# Patient Record
Sex: Female | Born: 1985 | Race: Black or African American | Hispanic: No | Marital: Single | Smoking: Never smoker
Health system: Southern US, Community
[De-identification: ages and names within clinical notes are randomized; demographics above are authoritative.]

## PROBLEM LIST (undated history)

## (undated) DIAGNOSIS — J45909 Unspecified asthma, uncomplicated: Secondary | ICD-10-CM

## (undated) DIAGNOSIS — D649 Anemia, unspecified: Secondary | ICD-10-CM

## (undated) HISTORY — DX: Unspecified asthma, uncomplicated: J45.909

## (undated) HISTORY — DX: Anemia, unspecified: D64.9

---

## 2020-06-06 NOTE — L&D Delivery Note (Addendum)
       Delivery Note   Corda Shutt is a 35 y.o. [id number] at [redacted]w[redacted]d Estimated Date of Delivery: 05/29/21  PRE-OPERATIVE DIAGNOSIS:  1) [redacted]w[redacted]d pregnancy.  2) 2 vessel cord 3)  Limited prenatal care 4)  Preterm labor  POST-OPERATIVE DIAGNOSIS:  1) [redacted]w[redacted]d pregnancy s/p Vaginal, Spontaneous  2)  Same  Delivery Type: Vaginal, Spontaneous    Delivery Anesthesia: None   Labor Complications:      ESTIMATED BLOOD LOSS: 100  ml    FINDINGS:   1) female infant, Apgar scores of    at 1 minute and    at 5 minutes and a birthweight of   ounces.    2) Nuchal cord: No  SPECIMENS:   PLACENTA:   Appearance: Intact    Removal: Spontaneous      Disposition:    DISPOSITION:  Infant to left in stable condition in the delivery room, with L&D personnel and mother,  NARRATIVE SUMMARY: Labor course:  Ms. Meda Dudzinski is a [id number] at [redacted]w[redacted]d who presented for labor management. She presented in advanced labor. She evidenced good maternal expulsive effort during the second stage. She went on to deliver a viable infant. The placenta delivered without problems and was noted to be complete. A perineal and vaginal examination was performed. Episiotomy/Lacerations: None    Elonda Husky, M.D. 05/03/2021 2:42 AM

## 2020-11-04 ENCOUNTER — Other Ambulatory Visit: Payer: Self-pay

## 2020-11-04 ENCOUNTER — Encounter: Payer: Self-pay | Admitting: Obstetrics and Gynecology

## 2020-11-04 ENCOUNTER — Ambulatory Visit (INDEPENDENT_AMBULATORY_CARE_PROVIDER_SITE_OTHER): Payer: BLUE CROSS/BLUE SHIELD | Admitting: Obstetrics and Gynecology

## 2020-11-04 VITALS — BP 125/80 | HR 93 | Ht 66.0 in | Wt 144.2 lb

## 2020-11-04 DIAGNOSIS — Z7689 Persons encountering health services in other specified circumstances: Secondary | ICD-10-CM | POA: Diagnosis not present

## 2020-11-04 NOTE — Progress Notes (Signed)
HPI:      [name] is a 35 y.o. [id number] who LMP was Patient's last menstrual period was 08/22/2020.  Subjective:   She presents today to establish care for her pregnancy.  She has not had an ultrasound yet but she thinks she is approximately [redacted] weeks pregnant by last menstrual period.  She has had 2 previous vaginal births at [redacted] weeks gestation.  She describes them as uncomplicated. She is experiencing some nausea and vomiting during this pregnancy. She is not yet taking prenatal vitamins. She does state that she had anemia with her 2 previous pregnancies and we have discussed over-the-counter iron with meals.    Hx: The following portions of the patient's history were reviewed and updated as appropriate:             She  has a past medical history of Anemia and Asthma. She does not have a problem list on file. She  has no past surgical history on file. Her family history includes Anemia in her brother; Asthma in her mother. She  reports that she has never smoked. She has never used smokeless tobacco. She reports previous alcohol use. She reports previous drug use. She currently has no medications in their medication list. She has No Known Allergies.       Review of Systems:  Review of Systems  Constitutional: Denied constitutional symptoms, night sweats, recent illness, fatigue, fever, insomnia and weight loss.  Eyes: Denied eye symptoms, eye pain, photophobia, vision change and visual disturbance.  Ears/Nose/Throat/Neck: Denied ear, nose, throat or neck symptoms, hearing loss, nasal discharge, sinus congestion and sore throat.  Cardiovascular: Denied cardiovascular symptoms, arrhythmia, chest pain/pressure, edema, exercise intolerance, orthopnea and palpitations.  Respiratory: Denied pulmonary symptoms, asthma, pleuritic pain, productive sputum, cough, dyspnea and wheezing.  Gastrointestinal: Denied, gastro-esophageal reflux, melena, nausea and vomiting.  Genitourinary: Denied  genitourinary symptoms including symptomatic vaginal discharge, pelvic relaxation issues, and urinary complaints.  Musculoskeletal: Denied musculoskeletal symptoms, stiffness, swelling, muscle weakness and myalgia.  Dermatologic: Denied dermatology symptoms, rash and scar.  Neurologic: Denied neurology symptoms, dizziness, headache, neck pain and syncope.  Psychiatric: Denied psychiatric symptoms, anxiety and depression.  Endocrine: Denied endocrine symptoms including hot flashes and night sweats.   Meds:   No current outpatient medications on file prior to visit.   No current facility-administered medications on file prior to visit.          Objective:     Vitals:   11/04/20 1005  BP: 125/80  Pulse: 93   Filed Weights   11/04/20 1005  Weight: 144 lb 3.2 oz (65.4 kg)              Urinary beta-hCG positive  Assessment:    [id number] There are no problems to display for this patient.    1. Encounter to establish care     Patient approximately 10 weeks estimated gestational age based on LMP.   Plan:            Prenatal Plan 1.  The patient was given prenatal literature. 2.  She was continued on prenatal vitamins. 3.  A prenatal lab panel to be drawn at nurse visit. 4.  An ultrasound was ordered to better determine an EDC. 5.  A nurse visit was scheduled. 6.  Genetic testing and testing for other inheritable conditions discussed in detail. She will decide in the future whether to have these labs performed. 7.  A general overview of pregnancy testing, visit schedule, ultrasound schedule, and  prenatal care was discussed. 8.  COVID and its risks associated with pregnancy, prevention by limiting exposure and use of masks, as well as the risks and benefits of vaccination during pregnancy were discussed in detail.  [facility] policy regarding office and hospital visitation and testing was explained. 9.  Benefits of breast-feeding discussed in detail including both maternal and  infant benefits. [employer] Baby website discussed. 10.  Literature on nausea vomiting of pregnancy given.   Orders Orders Placed This Encounter  Procedures  . US OB Comp Less 14 Wks    No orders of the defined types were placed in this encounter.     F/U  Return in about 3 weeks (around 11/25/2020). I spent 31 minutes involved in the care of this patient preparing to see the patient by obtaining and reviewing her medical history (including labs, imaging tests and prior procedures), documenting clinical information in the electronic health record (EHR), counseling and coordinating care plans, writing and sending prescriptions, ordering tests or procedures and directly communicating with the patient by discussing pertinent items from her history and physical exam as well as detailing my assessment and plan as noted above so that she has an informed understanding.  All of her questions were answered. Elonda Husky, M.D. 11/04/2020 10:28 AM

## 2020-11-05 ENCOUNTER — Other Ambulatory Visit: Payer: Self-pay | Admitting: Obstetrics and Gynecology

## 2020-11-05 DIAGNOSIS — Z7689 Persons encountering health services in other specified circumstances: Secondary | ICD-10-CM

## 2020-11-05 DIAGNOSIS — Z3491 Encounter for supervision of normal pregnancy, unspecified, first trimester: Secondary | ICD-10-CM

## 2020-11-16 ENCOUNTER — Other Ambulatory Visit: Payer: Self-pay

## 2020-11-16 ENCOUNTER — Ambulatory Visit (INDEPENDENT_AMBULATORY_CARE_PROVIDER_SITE_OTHER): Payer: BLUE CROSS/BLUE SHIELD

## 2020-11-16 VITALS — BP 119/74 | HR 94 | Ht 66.0 in | Wt 144.1 lb

## 2020-11-16 DIAGNOSIS — Z0283 Encounter for blood-alcohol and blood-drug test: Secondary | ICD-10-CM

## 2020-11-16 DIAGNOSIS — Z113 Encounter for screening for infections with a predominantly sexual mode of transmission: Secondary | ICD-10-CM

## 2020-11-16 DIAGNOSIS — Z3481 Encounter for supervision of other normal pregnancy, first trimester: Secondary | ICD-10-CM

## 2020-11-16 NOTE — Patient Instructions (Signed)
WHAT OB PATIENTS CAN EXPECT  Confirmation of pregnancy and ultrasound ordered if medically indicated-[redacted] weeks gestation New OB (NOB) intake with nurse and New OB (NOB) labs- [redacted] weeks gestation New OB (NOB) physical examination with provider- 11/[redacted] weeks gestation Flu vaccine-[redacted] weeks gestation Anatomy scan-[redacted] weeks gestation Glucose tolerance test, blood work to test for anemia, T-dap vaccine-[redacted] weeks gestation Vaginal swabs/cultures-STD/Group B strep-[redacted] weeks gestation Appointments every 4 weeks until 28 weeks Every 2 weeks from 28 weeks until 36 weeks Weekly visits from 36 weeks until delivery  Tests and Screening During Pregnancy Having certain tests and screenings during pregnancy is an important part of your prenatal care. These tests help your health care provider find problems that might affect your pregnancy. Some tests must be done for all pregnant women, and some are optional. Most of the tests and screenings do not pose any risks for you or your baby. You may need additional testing if any routinetests indicate a problem. Tests and screenings done early in pregnancy Some tests and screenings you can expect to have in early pregnancy include: Blood tests, such as: Complete blood count (CBC). This test is done to check your red and white blood cells. It can help identify a risk for anemia, infection, or bleeding. Blood typing. This test shows your blood type. It also shows whether you have a certain protein in your red blood cells called the Rh factor. It can be dangerous for your baby if you do not have this protein (Rh negative) and your baby has it (Rh positive). Tests to check for diseases that can cause birth defects or can be passed to your baby, such as: [place] measles (rubella) and chicken pox. The test indicates whether you are immune to these diseases. Hepatitis B and C. Human Immunodeficiency Virus (HIV). Syphilis. Zika virus, if you or your partner has traveled to an area  where the virus occurs. Urine testing. This checks for sugar in your urine and for signs of infection. Blood pressure. This is to check for high blood pressure and preeclampsia. Testing for sexually transmitted infections (STIs), such as chlamydia or gonorrhea. Testing for tuberculosis. You may have this skin test if you are at risk for tuberculosis. Fetal ultrasound. This is an imaging study of your growing baby. It uses sound waves to create pictures of your baby. This test may be done to help determine your due date and to ensure you do not have an ectopic pregnancy. An ectopic pregnancy is a pregnancy that grows outside of the uterus. Tests and screenings done later in pregnancy Certain tests are done for the first time later in the pregnancy. Some of the tests that were done in early pregnancy are repeated at this time. Some common tests you can expect to have later in pregnancy include: Rh antibody testing. If you are Rh negative, you will have a blood test at about 28 weeks of pregnancy to see if you are producing Rh antibodies. If you have not started to make antibodies, you will be given an injection to prevent you from making antibodies for the rest of your pregnancy. Glucose screening. This checks your blood sugar. It will show whether you are developing the type of diabetes that occurs during pregnancy (gestational diabetes). You may have this screening earlier if you have risk factors for diabetes. Screening for group B streptococcus (GBS). GBS is a type of bacteria that may live in your rectum or vagina. GBS can spread to your baby during birth. This  is done at 35-37 weeks of pregnancy. If testing is positive for GBS, you may be treated with antibiotic medicine. Urine and blood tests to monitor for other pregnancy problems, such as preeclampsia or anemia. Blood pressure to monitor for high blood pressure and preeclampsia. Fetal ultrasound. This may be repeated at 16-20 weeks to check how  your baby is growing and developing. Non-stress test. This test is done later in pregnancy to check your baby's heart rate. This may be repeated weekly if your pregnancy is high risk. Biophysical profile. This test includes ultrasound imaging and a non-stress test to ensure your baby is healthy. This test may help decide when your baby should be born. Screening for birth defects Some birth defects are caused by abnormal genes passed down through families. Early in your pregnancy, tests can be done to find out if your baby is at risk for a genetic disorder. This testing is optional. The type of testing recommended for you will depend on your family and medical history, your ethnicity, and your age. Testing may include: Screening tests. These tests may include an ultrasound, blood tests, or a combination of both. The blood tests are used to check for abnormal genes and the ultrasound is done to look for early birth defects. Carrier screening. This test involves checking the blood or saliva of both parents to see if they carry abnormal genes that could be passed down to a baby. If genetic screening shows that your baby is at risk for a genetic defect, additional diagnostic testing may be recommended, such as: Amniocentesis. This involves testing a sample of fluid from your womb (amniotic fluid). Chorionic villus sampling. In this test, a sample of cells from your placenta is checked for abnormal cells. Unlike other tests done during pregnancy, diagnostic testing does have some risk for your pregnancy. Talk to your health care provider about the risks andbenefits of genetic testing. Questions to ask your health care provider What routine tests are recommended for me? When and how will these tests be done? When will I get the results of routine tests? What do the results of these tests mean for me or my baby? Do you recommend any genetic screening tests? Which ones? Should I see a genetic counselor  before having genetic screening? Where to find more information American Pregnancy Association: americanpregnancy.org/prenatal-testing [employer]ians [employer] Gynecologists: JewelryExec.com.pt Office on [employer] Health: KeywordPortfolios.com.br [employer]: marchofdimes.[email or web address]/pregnancy Summary Having certain tests and screenings during pregnancy is an important part of your prenatal care. Talk to your health care provider about what tests are right for you and your baby. In early pregnancy, testing may be done to check your risks for various conditions that can affect you and your baby. Later in pregnancy, tests may be done to ensure that your baby is growing normally and that you and your baby are staying healthy during the pregnancy. Genetic testing is optional. Talk to your health care provider about the risks and benefits of genetic testing. This information is not intended to replace advice given to you by your health care provider. Make sure you discuss any questions you have with your healthcare provider. Document Revised: 02/11/2020 Document Reviewed: 02/11/2020 Elsevier Patient Education  2022 [employer]. How a Baby Grows During Pregnancy Pregnancy begins when a female's sperm enters a female's egg. This is called fertilization. Fertilization usually happens in one of the fallopian tubes that connect the ovaries to the uterus. The fertilized egg moves down the fallopian tube to  the uterus. Once it reaches the uterus, it implants into the lining ofthe uterus and begins to grow. For the first 8 weeks, the fertilized egg is called an embryo. After 8 weeks, it is called a fetus. As the fetus continues to grow, it receives oxygen and nutrients through the placenta, which is an organ that grows to support the developing baby. The placenta is the life support system for the baby. Itprovides oxygen and nutrition and removes waste. How long does a typical pregnancy last? A pregnancy  usually lasts 280 days, or about 40 weeks. Pregnancy is divided into three periods of growth, also called trimesters: First trimester: 0-12 weeks. Second trimester: 13-27 weeks. Third trimester: 28-40 weeks. The day when your baby is ready to be born (full term) is your estimated date of delivery. However, most babies are not born ontheir estimated date of delivery. How does my baby develop month by month?  First month The fertilized egg attaches to the inside of the uterus. Some cells will form the placenta. Others will form the fetus. The arms, legs, brain, spinal cord, lungs, and heart begin to develop. At the end of the first month, the heart begins to beat. Second month The bones, inner ear, eyelids, hands, and feet form. The genitals develop. By the end of 8 weeks, all major organs are developing. Third month All of the internal organs are forming. Teeth develop below the gums. Bones and muscles begin to grow. The spine can flex. The skin is transparent. Fingernails and toenails begin to form. Arms and legs continue to grow longer, and hands and feet develop. The fetus is about 3 inches (7.6 cm) long. Fourth month The placenta is completely formed. The external sex organs, neck, outer ear, eyebrows, eyelids, and fingernails are formed. The fetus can hear, swallow, and move its arms and legs. The kidneys begin to produce urine. The skin is covered with a white, waxy coating (vernix) and very fine hair (lanugo). Fifth month The fetus moves around more and can be felt for the first time (quickening). The fetus starts to sleep and wake up and may begin to suck a finger. The nails grow to the end of the fingers. The organ in the digestive system that makes bile (gallbladder) functions and helps to digest nutrients. If the fetus is a female, eggs are present in the ovaries. If the fetus is a female, testicles start to move down into the scrotum. Sixth month The lungs are  formed. The eyes open. The brain continues to develop. Your baby has fingerprints and toe prints. Your baby's hair grows thicker. At the end of the second trimester, the fetus is about 9 inches (22.9 cm) long. Seventh month The fetus kicks and stretches. The eyes are developed enough to sense changes in light. The hands can make a grasping motion. The fetus responds to sound. Eighth month Most organs and body systems are fully developed and functioning. Bones harden, and taste buds develop. The fetus may hiccup. Certain areas of the brain are still developing. The skull remains soft. Ninth month The fetus gains about  lb (0.23 kg) each week. The lungs are fully developed. Patterns of sleep develop. The fetus's head typically moves into a head-down position (vertex) in the uterus to prepare for birth. The fetus weighs 6-9 lb (2.72-4.08 kg) and is 19-20 inches (48.26-50.8 cm) long. How do I know if my baby is developing well? Always talk with your health care provider about any concerns that you  may have about your pregnancy and your baby. At each prenatal visit, your health care provider will do several different tests to check on your health and keep track of your baby's development. These include: Fundal height and position. To do this, your health care provider will: Measure your growing belly from your pubic bone to the top of the uterus using a tape measure. Feel your belly to determine your baby's position. Heartbeat. An ultrasound in the first trimester can confirm pregnancy and show a heartbeat, depending on how far along you are. Your health care provider will check your baby's heart rate at every prenatal visit. You will also have a second trimester ultrasound to check your baby'sdevelopment. Follow these instructions at home: Take prenatal vitamins as told by your health care provider. These include vitamins such as folic acid, iron, calcium, and vitamin D. They are important  for healthy development. Take over-the-counter and prescription medicines only as told by your health care provider. Keep all follow-up visits. This is important. Follow-up visits include prenatal care and screening tests. Summary A pregnancy usually lasts 280 days, or about 40 weeks. Pregnancy is divided into three periods of growth, also called trimesters. Your health care provider will monitor your baby's growth and development throughout your pregnancy. Follow your health care provider's recommendations about taking prenatal vitamins and medicines during your pregnancy. Talk with your health care provider if you have any concerns about your pregnancy or your developing baby. This information is not intended to replace advice given to you by your health care provider. Make sure you discuss any questions you have with your healthcare provider. Document Revised: 10/30/2019 Document Reviewed: 09/05/2019 Elsevier Patient Education  2022 [place]. Heartburn During Pregnancy  Heartburn is a type of pain or discomfort in the throat or chest. It may cause a burning feeling. It happens when stomach acid backs up into the part of the body that moves food from your mouth to your stomach (esophagus). This condition is also called acid reflux. Heartburn is common duringpregnancy. It usually goes away or gets better after giving birth. What are the causes? This condition is caused by stomach acid that backs up into the part of the body that moves food from your mouth to your stomach. Acid can back up because of: Changing amounts of hormones in the body. Large meals. Certain foods and drinks. Exercise. More acid being made in the stomach. What increases the risk? You are more likely to develop this condition if: You had heartburn before you became pregnant. You have been pregnant more than once before. You are overweight or obese. Heartburn is also likely to happen as you get further along in your  pregnancy. The risk is higher in the last 3 months before birth (third trimester). What are the signs or symptoms? Symptoms of this condition include: Burning pain in the chest or lower throat. A bitter taste in the mouth. Coughing. Problems swallowing. Vomiting. A hoarse voice. Asthma. Symptoms may get worse when you lie down or bend over. You may feel worse atnight. How is this treated? Treatment for this condition depends on how bad your symptoms are. Your doctor may ask you to: Take over-the-counter medicines for mild heartburn. These medicines include antacids or acid reducers. Take prescription medicines to reduce stomach acid or to protect your stomach. Change your diet. Raise the head of your bed so it is higher than the foot of the bed. Follow these instructions at home: Eating and drinking Do not  drink alcohol while you are pregnant. Learn which foods and drinks make you feel worse, and avoid them. Eat small meals often, instead of large meals. Avoid drinking a lot of liquid with your meals. Avoid eating meals during the 2-3 hours before you go to bed. Avoid lying down right after you eat. Do not exercise right after you eat. Drinks to avoid Coffee and tea (with or without caffeine). Energy drinks and sports drinks. Carbonated drinks or sodas. Citrus fruit juices. Foods to avoid Chocolate and cocoa. Peppermint and mint flavorings. Garlic, onions, and horseradish. Spicy foods and foods that have a lot of acid in them. These include peppers, chili powder, curry powder, vinegar, hot sauces, and barbecue sauce. Citrus fruits, such as oranges, lemons, and limes. Tomato-based foods, such as red sauce, chili, and salsa. Fried and fatty foods, such as donuts, french fries, potato chips, and high-fat dressings. High-fat meats, such as hot dogs, precooked or cured meats, sausage, ham, and bacon. High-fat dairy items, such as whole milk, butter, and cheese. Medicines Take  over-the-counter and prescription medicines only as told by your doctor. Do not take aspirin or NSAIDs, such as ibuprofen, unless your doctor tells you to do that. Your doctor may tell you to avoid medicines that have sodium bicarbonate in them. General instructions If told, raise the head of your bed about 6 inches (15 cm). You can do this by putting blocks under the legs. Sleeping with more pillows does not help with heartburn. Do not use any products that contain nicotine or tobacco, such as cigarettes, e-cigarettes, and chewing tobacco. If you need help quitting, ask your doctor. Wear loose-fitting clothing. Try to lower your stress, such as with yoga or meditation. If you need help, ask your doctor. Stay at a healthy weight. If you are overweight, work with your doctor to safely manage your weight. Keep all follow-up visits as told by your doctor. This is important. Where to find more information American Pregnancy Association: americanpregnancy.org Contact a doctor if: You get new symptoms. Your symptoms do not get better with treatment. You lose weight and you do not know why. You have trouble swallowing. You make loud sounds when you breathe (wheeze). You have a cough that does not go away. You have heartburn often for more than 2 weeks. You feel like you may vomit (nausea), or you vomit, and this does not get better with treatment. You have pain in your belly (abdomen). Get help right away if: You have very bad chest pain that spreads to your arm, neck, or jaw. You feel sweaty, dizzy, or light-headed. You have trouble breathing. You have pain when swallowing. You vomit, and your vomit looks like blood or coffee grounds. Your poop (stool) is bloody or black. Summary Heartburn in pregnancy is common, especially during the last 3 months before birth. This condition is caused by stomach acid backing up into the part of the body that moves food from the mouth to the stomach. This  condition can be treated with medicines, changes to your diet, or raising the head of your bed. Contact a doctor if your symptoms do not go away or you get new symptoms. This information is not intended to replace advice given to you by your health care provider. Make sure you discuss any questions you have with your healthcare provider. Document Revised: 02/13/2019 Document Reviewed: 02/13/2019 Elsevier Patient Education  2022 [employer]. [email or web address].com.cy.html">  First Trimester of Pregnancy  The first trimester of pregnancy starts on the  first day of your last menstrual period until the end of week 12. This is also called months 1 through 3 ofpregnancy. Body changes during your first trimester Your body goes through many changes during pregnancy. The changes usuallyreturn to normal after your baby is born. Physical changes You may gain or lose weight. Your breasts may grow larger and hurt. The area around your nipples may get darker. Dark spots or blotches may develop on your face. You may have changes in your hair. Health changes You may feel like you might vomit (nauseous), and you may vomit. You may have heartburn. You may have headaches. You may have trouble pooping (constipation). Your gums may bleed. Other changes You may get tired easily. You may pee (urinate) more often. Your menstrual periods will stop. You may not feel hungry. You may want to eat certain kinds of food. You may have changes in your emotions from day to day. You may have more dreams. Follow these instructions at home: Medicines Take over-the-counter and prescription medicines only as told by your doctor. Some medicines are not safe during pregnancy. Take a prenatal vitamin that contains at least 600 micrograms (mcg) of folic acid. Eating and drinking Eat healthy meals that include: Fresh fruits and vegetables. Whole grains. Good sources of protein, such as meat, eggs, or  tofu. Low-fat dairy products. Avoid raw meat and unpasteurized juice, milk, and cheese. If you feel like you may vomit, or you vomit: Eat 4 or 5 small meals a day instead of 3 large meals. Try eating a few soda crackers. Drink liquids between meals instead of during meals. You may need to take these actions to prevent or treat trouble pooping: Drink enough fluids to keep your pee (urine) pale yellow. Eat foods that are high in fiber. These include beans, whole grains, and fresh fruits and vegetables. Limit foods that are high in fat and sugar. These include fried or sweet foods. Activity Exercise only as told by your doctor. Most people can do their usual exercise routine during pregnancy. Stop exercising if you have cramps or pain in your lower belly (abdomen) or low back. Do not exercise if it is too hot or too humid, or if you are in a place of great height (high altitude). Avoid heavy lifting. If you choose to, you may have sex unless your doctor tells you not to. Relieving pain and discomfort Wear a good support bra if your breasts are sore. Rest with your legs raised (elevated) if you have leg cramps or low back pain. If you have bulging veins (varicose veins) in your legs: Wear support hose as told by your doctor. Raise your feet for 15 minutes, 3-4 times a day. Limit salt in your food. Safety Wear your seat belt at all times when you are in a car. Talk with your doctor if someone is hurting you or yelling at you. Talk with your doctor if you are feeling sad or have thoughts of hurting yourself. Lifestyle Do not use hot tubs, steam rooms, or saunas. Do not douche. Do not use tampons or scented sanitary pads. Do not use herbal medicines, illegal drugs, or medicines that are not approved by your doctor. Do not drink alcohol. Do not smoke or use any products that contain nicotine or tobacco. If you need help quitting, ask your doctor. Avoid cat litter boxes and soil that is used  by cats. These carry germs that can cause harm to the baby and can cause a loss of  your baby by miscarriage or stillbirth. General instructions Keep all follow-up visits. This is important. Ask for help if you need counseling or if you need help with nutrition. Your doctor can give you advice or tell you where to go for help. Visit your dentist. At home, brush your teeth with a soft toothbrush. Floss gently. Write down your questions. Take them to your prenatal visits. Where to find more information American Pregnancy Association: americanpregnancy.org [employer]: www.acog.org Office on Women's Health: KeywordPortfolios.com.br Contact a doctor if: You are dizzy. You have a fever. You have mild cramps or pressure in your lower belly. You have a nagging pain in your belly area. You continue to feel like you may vomit, you vomit, or you have watery poop (diarrhea) for 24 hours or longer. You have a bad-smelling fluid coming from your vagina. You have pain when you pee. You are exposed to a disease that spreads from person to person, such as chickenpox, measles, Zika virus, HIV, or hepatitis. Get help right away if: You have spotting or bleeding from your vagina. You have very bad belly cramping or pain. You have shortness of breath or chest pain. You have any kind of injury, such as from a fall or a car crash. You have new or increased pain, swelling, or redness in an arm or leg. Summary The first trimester of pregnancy starts on the first day of your last menstrual period until the end of week 12 (months 1 through 3). Eat 4 or 5 small meals a day instead of 3 large meals. Do not smoke or use any products that contain nicotine or tobacco. If you need help quitting, ask your doctor. Keep all follow-up visits. This information is not intended to replace advice given to you by your health care provider. Make sure you discuss any questions you have  with your healthcare provider. Document Revised: 10/30/2019 Document Reviewed: 09/05/2019 Elsevier Patient Education  2022 [place]. Commonly Asked Questions During Pregnancy  Cats: A parasite can be excreted in cat feces.  To avoid exposure you need to have another person empty the little box.  If you must empty the litter box you will need to wear gloves.  Wash your hands after handling your cat.  This parasite can also be found in raw or undercooked meat so this should also be avoided.  Colds, Sore Throats, Flu: Please check your medication sheet to see what you can take for symptoms.  If your symptoms are unrelieved by these medications please call the office.  Dental Work: Most any dental work Investment banker, corporate recommends is permitted.  X-rays should only be taken during the first trimester if absolutely necessary.  Your abdomen should be shielded with a lead apron during all x-rays.  Please notify your provider prior to receiving any x-rays.  Novocaine is fine; gas is not recommended.  If your dentist requires a note from [place] prior to dental work please call the office and we will provide one for you.  Exercise: Exercise is an important part of staying healthy during your pregnancy.  You may continue most exercises you were accustomed to prior to pregnancy.  Later in your pregnancy you will most likely notice you have difficulty with activities requiring balance like riding a bicycle.  It is important that you listen to your body and avoid activities that put you at a higher risk of falling.  Adequate rest and staying well hydrated are a must!  If you  have questions about the safety of specific activities ask your provider.    Exposure to Children with illness: Try to avoid obvious exposure; report any symptoms to [place] when noted,  If you have chicken pos, red measles or mumps, you should be immune to these diseases.   Please do not take any vaccines while pregnant unless you have checked with your  OB provider.  Fetal Movement: After 28 weeks we recommend you do "kick counts" twice daily.  Lie or sit down in a calm quiet environment and count your baby movements "kicks".  You should feel your baby at least 10 times per hour.  If you have not felt 10 kicks within the first hour get up, walk around and have something sweet to eat or drink then repeat for an additional hour.  If count remains less than 10 per hour notify your provider.  Fumigating: Follow your pest control agent's advice as to how long to stay out of your home.  Ventilate the area well before re-entering.  Hemorrhoids:   Most over-the-counter preparations can be used during pregnancy.  Check your medication to see what is safe to use.  It is important to use a stool softener or fiber in your diet and to drink lots of liquids.  If hemorrhoids seem to be getting worse please call the office.   Hot Tubs:  Hot tubs Jacuzzis and saunas are not recommended while pregnant.  These increase your internal body temperature and should be avoided.  Intercourse:  Sexual intercourse is safe during pregnancy as long as you are comfortable, unless otherwise advised by your provider.  Spotting may occur after intercourse; report any bright red bleeding that is heavier than spotting.  Labor:  If you know that you are in labor, please go to the hospital.  If you are unsure, please call the office and let us help you decide what to do.  Lifting, straining, etc:  If your job requires heavy lifting or straining please check with your provider for any limitations.  Generally, you should not lift items heavier than that you can lift simply with your hands and arms (no back muscles)  Painting:  Paint fumes do not harm your pregnancy, but may make you ill and should be avoided if possible.  Latex or water based paints have less odor than oils.  Use adequate ventilation while painting.  Permanents & Hair Color:  Chemicals in hair dyes are not recommended  as they cause increase hair dryness which can increase hair loss during pregnancy.  " Highlighting" and permanents are allowed.  Dye may be absorbed differently and permanents may not hold as well during pregnancy.  Sunbathing:  Use a sunscreen, as skin burns easily during pregnancy.  Drink plenty of fluids; avoid over heating.  Tanning Beds:  Because their possible side effects are still unknown, tanning beds are not recommended.  Ultrasound Scans:  Routine ultrasounds are performed at approximately 20 weeks.  You will be able to see your baby's general anatomy an if you would like to know the gender this can usually be determined as well.  If it is questionable when you conceived you may also receive an ultrasound early in your pregnancy for dating purposes.  Otherwise ultrasound exams are not routinely performed unless there is a medical necessity.  Although you can request a scan we ask that you pay for it when conducted because insurance does not cover " patient request" scans.  Work: If your pregnancy proceeds  without complications you may work until your due date, unless your physician or employer advises otherwise.  Round Ligament Pain/Pelvic Discomfort:  Sharp, shooting pains not associated with bleeding are fairly common, usually occurring in the second trimester of pregnancy.  They tend to be worse when standing up or when you remain standing for long periods of time.  These are the result of pressure of certain pelvic ligaments called "round ligaments".  Rest, Tylenol and heat seem to be the most effective relief.  As the womb and fetus grow, they rise out of the pelvis and the discomfort improves.  Please notify the office if your pain seems different than that described.  It may represent a more serious condition.  Common Medications Safe in Pregnancy  Acne:      Constipation:  Benzoyl Peroxide     Colace  Clindamycin      Dulcolax Suppository  Topica  Erythromycin     Fibercon  Salicylic Acid      Metamucil         Miralax AVOID:        Senakot   Accutane    Cough:  Retin-A       Cough Drops  Tetracycline      Phenergan w/ Codeine if Rx  Minocycline      Robitussin (Plain & DM)  Antibiotics:     Crabs/Lice:  Ceclor       RID  Cephalosporins    AVOID:  E-Mycins      Kwell  Keflex  Macrobid/Macrodantin   Diarrhea:  Penicillin      Kao-Pectate  Zithromax      Imodium AD         PUSH FLUIDS AVOID:       Cipro     Fever:  Tetracycline      Tylenol (Regular or Extra  Minocycline       Strength)  Levaquin      Extra Strength-Do not          Exceed 8 tabs/24 hrs Caffeine:        '200mg'$ /day (equiv. To 1 cup of coffee or  approx. 3 12 oz sodas)         Gas: Cold/Hayfever:       Gas-X  Benadryl      Mylicon  Claritin       Phazyme  **Claritin-D        Chlor-Trimeton    Headaches:  Dimetapp      ASA-Free Excedrin  Drixoral-Non-Drowsy     Cold Compress  Mucinex (Guaifenasin)     Tylenol (Regular or Extra  Sudafed/Sudafed-12 Hour     Strength)  **Sudafed PE Pseudoephedrine   Tylenol Cold & Sinus     Vicks Vapor Rub  Zyrtec  **AVOID if Problems With Blood Pressure         Heartburn: Avoid lying down for at least 1 hour after meals  Aciphex      Maalox     Rash:  Milk of Magnesia     Benadryl    Mylanta       1% Hydrocortisone Cream  Pepcid  Pepcid Complete   Sleep Aids:  Prevacid      Ambien   Prilosec       Benadryl  Rolaids       Chamomile Tea  Tums (Limit 4/day)     Unisom         Tylenol PM  Warm milk-add vanilla or  Hemorrhoids:       Sugar for taste  Anusol/Anusol H.C.  (RX: Analapram 2.5%)  Sugar Substitutes:  Hydrocortisone OTC     Ok in moderation  Preparation H      Tucks        Vaseline lotion applied to tissue with wiping    Herpes:     Throat:  Acyclovir      Oragel  Famvir  Valtrex     Vaccines:         Flu Shot Leg Cramps:       *Gardasil  Benadryl      Hepatitis  A         Hepatitis B Nasal Spray:       Pneumovax  Saline Nasal Spray     Polio Booster         Tetanus Nausea:       Tuberculosis test or PPD  Vitamin B6 25 mg TID   AVOID:    Dramamine      *Gardasil  Emetrol       Live Poliovirus  Ginger Root 250 mg QID    MMR (measles, mumps &  High Complex Carbs @ Bedtime    rebella)  Sea Bands-Accupressure    Varicella (Chickenpox)  Unisom 1/2 tab TID     *No known complications           If received before Pain:         Known pregnancy;   Darvocet       Resume series after  Lortab        Delivery  Percocet    Yeast:   Tramadol      Femstat  Tylenol 3      Gyne-lotrimin  Ultram       Monistat  Vicodin           MISC:         All Sunscreens           Hair Coloring/highlights          Insect Repellant's          (Including DEET)         Mystic Tans

## 2020-11-16 NOTE — Progress Notes (Signed)
      Harle Battiest presents for NOB nurse intake visit. Pregnancy confirmation done at [facility], 11/04/2020, with Linzie Collin, MD.  G 6.  P 2032.  LMP 08/22/2020.  EDD 05/29/2021.  Ga [redacted]w[redacted]d. Pregnancy education material explained and given. 0 cats/ 1 dog in the home.  NOB labs ordered. BMI less than 30. TSH/HbgA1c not ordered. Sickle cell order due to race. HIV and drug screen explained and ordered. Genetic screening discussed. Genetic testing; Pt is aware that she will have genetic testing completed during her NOB PE with the Provider. Pt to discuss genetic testing with provider. PNV encouraged. Pt to follow up with provider in 1 weeks for NOB physical. [facility] Financial Policy, FMLA form, HIV/Drug screening forms all signed and reviewed with patient.

## 2020-11-17 LAB — URINALYSIS, ROUTINE W REFLEX MICROSCOPIC
Bilirubin, UA: NEGATIVE
Glucose, UA: NEGATIVE
Nitrite, UA: NEGATIVE
RBC, UA: NEGATIVE
Specific Gravity, UA: 1.022 (ref 1.005–1.030)
Urobilinogen, Ur: 1 mg/dL (ref 0.2–1.0)
pH, UA: 6 (ref 5.0–7.5)

## 2020-11-17 LAB — MICROSCOPIC EXAMINATION
Casts: NONE SEEN /lpf
Epithelial Cells (non renal): >10 /hpf — AB (ref 0–10)
RBC, Urine: NONE SEEN /hpf (ref 0–2)

## 2020-11-17 LAB — DRUG PROFILE, UR, 9 DRUGS (LABCORP)
Amphetamines, Urine: NEGATIVE ng/mL
Barbiturate Quant, Ur: NEGATIVE ng/mL
Benzodiazepine Quant, Ur: NEGATIVE ng/mL
Cannabinoid Quant, Ur: NEGATIVE ng/mL
Cocaine (Metab.): NEGATIVE ng/mL
Methadone Screen, Urine: NEGATIVE ng/mL
Opiate Quant, Ur: NEGATIVE ng/mL
PCP Quant, Ur: NEGATIVE ng/mL
Propoxyphene: NEGATIVE ng/mL

## 2020-11-17 LAB — NICOTINE SCREEN, URINE: Cotinine Ql Scrn, Ur: NEGATIVE ng/mL

## 2020-11-18 LAB — VIRAL HEPATITIS HBV, HCV
HCV Ab: <0.1 s/co ratio (ref 0.0–0.9)
Hep B Core Total Ab: NEGATIVE
Hep B Surface Ab, Qual: REACTIVE
Hepatitis B Surface Ag: NEGATIVE

## 2020-11-18 LAB — RUBELLA SCREEN: Rubella Antibodies, IGG: 5.1 index (ref >0.99)

## 2020-11-18 LAB — HIV ANTIBODY (ROUTINE TESTING W REFLEX): HIV Screen 4th Generation wRfx: NONREACTIVE

## 2020-11-18 LAB — ABO AND RH: Rh Factor: POSITIVE

## 2020-11-18 LAB — CULTURE, OB URINE

## 2020-11-18 LAB — HGB SOLU + RFLX FRAC: Sickle Solubility Test - HGBRFX: NEGATIVE

## 2020-11-18 LAB — ANTIBODY SCREEN: Antibody Screen: NEGATIVE

## 2020-11-18 LAB — RPR: RPR Ser Ql: NONREACTIVE

## 2020-11-18 LAB — GC/CHLAMYDIA PROBE AMP
Chlamydia trachomatis, NAA: NEGATIVE
Neisseria Gonorrhoeae by PCR: NEGATIVE

## 2020-11-18 LAB — HCV INTERPRETATION

## 2020-11-18 LAB — VARICELLA ZOSTER ANTIBODY, IGG: Varicella zoster IgG: 917 index (ref >165)

## 2020-11-18 LAB — URINE CULTURE, OB REFLEX

## 2020-11-23 ENCOUNTER — Other Ambulatory Visit: Payer: Self-pay | Admitting: Obstetrics and Gynecology

## 2020-11-23 ENCOUNTER — Other Ambulatory Visit: Payer: Self-pay

## 2020-11-23 ENCOUNTER — Ambulatory Visit
Admission: RE | Admit: 2020-11-23 | Discharge: 2020-11-23 | Disposition: A | Discharge status: Home / Self Care | Payer: BLUE CROSS/BLUE SHIELD | Source: Ambulatory Visit | Attending: Obstetrics and Gynecology | Admitting: Obstetrics and Gynecology

## 2020-11-23 DIAGNOSIS — Z7689 Persons encountering health services in other specified circumstances: Secondary | ICD-10-CM

## 2020-11-23 DIAGNOSIS — Z3491 Encounter for supervision of normal pregnancy, unspecified, first trimester: Secondary | ICD-10-CM

## 2020-11-26 ENCOUNTER — Encounter: Payer: BLUE CROSS/BLUE SHIELD | Admitting: Obstetrics and Gynecology

## 2020-12-10 ENCOUNTER — Encounter: Payer: Self-pay | Admitting: Obstetrics and Gynecology

## 2021-01-08 ENCOUNTER — Other Ambulatory Visit: Payer: Self-pay | Admitting: Family Medicine

## 2021-01-08 DIAGNOSIS — Z3689 Encounter for other specified antenatal screening: Secondary | ICD-10-CM

## 2021-02-02 ENCOUNTER — Ambulatory Visit (HOSPITAL_BASED_OUTPATIENT_CLINIC_OR_DEPARTMENT_OTHER): Payer: Self-pay | Admitting: Obstetrics and Gynecology

## 2021-02-02 ENCOUNTER — Other Ambulatory Visit: Payer: Self-pay | Admitting: Family Medicine

## 2021-02-02 ENCOUNTER — Other Ambulatory Visit: Payer: Self-pay

## 2021-02-02 ENCOUNTER — Ambulatory Visit: Payer: Self-pay | Attending: Obstetrics and Gynecology

## 2021-02-02 DIAGNOSIS — O99512 Diseases of the respiratory system complicating pregnancy, second trimester: Secondary | ICD-10-CM | POA: Insufficient documentation

## 2021-02-02 DIAGNOSIS — Z3A23 23 weeks gestation of pregnancy: Secondary | ICD-10-CM

## 2021-02-02 DIAGNOSIS — O358XX0 Maternal care for other (suspected) fetal abnormality and damage, not applicable or unspecified: Secondary | ICD-10-CM

## 2021-02-02 DIAGNOSIS — O09892 Supervision of other high risk pregnancies, second trimester: Secondary | ICD-10-CM

## 2021-02-02 DIAGNOSIS — J4520 Mild intermittent asthma, uncomplicated: Secondary | ICD-10-CM | POA: Insufficient documentation

## 2021-02-02 DIAGNOSIS — O09899 Supervision of other high risk pregnancies, unspecified trimester: Secondary | ICD-10-CM

## 2021-02-02 DIAGNOSIS — Z3689 Encounter for other specified antenatal screening: Secondary | ICD-10-CM

## 2021-02-02 NOTE — Progress Notes (Addendum)
Maternal-Fetal Medicine   Name: Alice Reyes DOB: 09/14/1985 MRN: [id number] Referring Provider: Dr. Karie Fetch, MD, Ob Gyn, [facility]  I had the pleasure of seeing Alice Reyes today at [facility]. She is G6 [phone] at 23-weeks' gestation and is here for fetal anatomy scan.  On cell free fetal DNA screening, the risks of fetal aneuploidies are not increased. Obstetrical history significant for 2 term vaginal deliveries and both her children are in good health.  Past medical history: No history of diabetes or hypertension.  She has mild intermittent asthma. Medications: Prenatal vitamins, iron supplements. Allergies: No known drug allergies. Social history: Denies tobacco or drug or alcohol use.  Ultrasound We performed fetal anatomical survey.  Amniotic fluid is normal and good fetal activity seen.  Single umbilical artery is seen.  No other markers of aneuploidies or fetal structural defects are seen.  Fetal biometry is consistent with the previously established dates.  Our concerns include: Single umbilical artery (SUA) SUA is seen in about 1% of fetuses. In the absence of other anomalies, the risk for aneuploidies is not increased. However, ultrasound has limitations in detecting fetal anomalies that may be missed on target at anatomical survey.  SUA can be associated with fetal growth restriction, and we recommend serial growth scans for fetal growth assessment. The association of SUA with cardiac anomalies is not conclusively proven.  I reassured the patient of normal cardiac anatomy seen on today's ultrasound.  I discussed the significance and limitations of cell free fetal DNA screening in detecting aneuploidies.  I informed her only amniocentesis will give a definitive result on the fetal karyotype and some genetic conditions.  Explained amniocentesis procedure and possible complications including miscarriage (1 and 500  procedures).   Patient understands that ultrasound has limitations in detecting fetal anomalies.  She had opted not to have amniocentesis.  Recommendations -An appointment was made for her to return in 4 weeks for fetal growth assessment. -Fetal growth assessments every 4 weeks till delivery.  Thank you for consultation.  If you have any questions or concerns, please contact me the [facility].  Consultation including face-to-face counseling 30 minutes.

## 2021-02-02 NOTE — Progress Notes (Signed)
0 0

## 2021-03-04 ENCOUNTER — Other Ambulatory Visit: Payer: Self-pay

## 2021-03-04 DIAGNOSIS — O09899 Supervision of other high risk pregnancies, unspecified trimester: Secondary | ICD-10-CM

## 2021-03-09 ENCOUNTER — Other Ambulatory Visit: Payer: Self-pay

## 2021-03-09 ENCOUNTER — Ambulatory Visit: Payer: Self-pay | Attending: Obstetrics and Gynecology

## 2021-03-09 DIAGNOSIS — O368930 Maternal care for other specified fetal problems, third trimester, not applicable or unspecified: Secondary | ICD-10-CM | POA: Insufficient documentation

## 2021-03-09 DIAGNOSIS — Z3A28 28 weeks gestation of pregnancy: Secondary | ICD-10-CM | POA: Insufficient documentation

## 2021-03-09 DIAGNOSIS — O09893 Supervision of other high risk pregnancies, third trimester: Secondary | ICD-10-CM

## 2021-03-09 DIAGNOSIS — Z362 Encounter for other antenatal screening follow-up: Secondary | ICD-10-CM

## 2021-03-09 DIAGNOSIS — O09899 Supervision of other high risk pregnancies, unspecified trimester: Secondary | ICD-10-CM

## 2021-03-09 DIAGNOSIS — Z363 Encounter for antenatal screening for malformations: Secondary | ICD-10-CM | POA: Insufficient documentation

## 2021-03-09 DIAGNOSIS — Z3A38 38 weeks gestation of pregnancy: Secondary | ICD-10-CM

## 2021-04-13 ENCOUNTER — Other Ambulatory Visit: Payer: Self-pay

## 2021-04-13 DIAGNOSIS — O09899 Supervision of other high risk pregnancies, unspecified trimester: Secondary | ICD-10-CM

## 2021-04-15 ENCOUNTER — Other Ambulatory Visit: Payer: Self-pay

## 2021-04-15 ENCOUNTER — Ambulatory Visit: Payer: Self-pay | Attending: Maternal & Fetal Medicine

## 2021-04-15 DIAGNOSIS — O09899 Supervision of other high risk pregnancies, unspecified trimester: Secondary | ICD-10-CM

## 2021-04-15 DIAGNOSIS — Z363 Encounter for antenatal screening for malformations: Secondary | ICD-10-CM | POA: Insufficient documentation

## 2021-04-15 DIAGNOSIS — Z3A33 33 weeks gestation of pregnancy: Secondary | ICD-10-CM | POA: Insufficient documentation

## 2021-04-15 DIAGNOSIS — O09893 Supervision of other high risk pregnancies, third trimester: Secondary | ICD-10-CM | POA: Insufficient documentation

## 2021-05-03 ENCOUNTER — Inpatient Hospital Stay
Admission: EM | Admit: 2021-05-03 | Discharge: 2021-05-05 | DRG: 807 | Disposition: A | Discharge status: Home / Self Care | Payer: Medicaid Other | Attending: Obstetrics and Gynecology | Admitting: Obstetrics and Gynecology

## 2021-05-03 ENCOUNTER — Other Ambulatory Visit: Payer: Self-pay

## 2021-05-03 ENCOUNTER — Encounter: Payer: Self-pay | Admitting: Obstetrics and Gynecology

## 2021-05-03 DIAGNOSIS — Z20822 Contact with and (suspected) exposure to covid-19: Secondary | ICD-10-CM | POA: Diagnosis present

## 2021-05-03 DIAGNOSIS — J45909 Unspecified asthma, uncomplicated: Secondary | ICD-10-CM | POA: Diagnosis present

## 2021-05-03 DIAGNOSIS — O26893 Other specified pregnancy related conditions, third trimester: Secondary | ICD-10-CM | POA: Diagnosis present

## 2021-05-03 DIAGNOSIS — O0933 Supervision of pregnancy with insufficient antenatal care, third trimester: Secondary | ICD-10-CM

## 2021-05-03 DIAGNOSIS — Z3A36 36 weeks gestation of pregnancy: Secondary | ICD-10-CM

## 2021-05-03 DIAGNOSIS — O9952 Diseases of the respiratory system complicating childbirth: Secondary | ICD-10-CM | POA: Diagnosis present

## 2021-05-03 LAB — CBC
HCT: 29.2 % — ABNORMAL LOW (ref 36.0–46.0)
Hemoglobin: 8.5 g/dL — ABNORMAL LOW (ref 12.0–15.0)
MCH: 21.6 pg — ABNORMAL LOW (ref 26.0–34.0)
MCHC: 29.1 g/dL — ABNORMAL LOW (ref 30.0–36.0)
MCV: 74.1 fL — ABNORMAL LOW (ref 80.0–100.0)
Platelets: 247 10*3/uL (ref 150–400)
RBC: 3.94 MIL/uL (ref 3.87–5.11)
RDW: 17.1 % — ABNORMAL HIGH (ref 11.5–15.5)
WBC: 8.4 10*3/uL (ref 4.0–10.5)
nRBC: 0 % (ref 0.0–0.2)

## 2021-05-03 LAB — ABO/RH: ABO/RH(D): A POS

## 2021-05-03 LAB — TYPE AND SCREEN
ABO/RH(D): A POS
Antibody Screen: NEGATIVE

## 2021-05-03 LAB — RESP PANEL BY RT-PCR (FLU A&B, COVID) ARPGX2
Influenza A by PCR: NEGATIVE
Influenza B by PCR: NEGATIVE
SARS Coronavirus 2 by RT PCR: NEGATIVE

## 2021-05-03 LAB — RPR: RPR Ser Ql: NONREACTIVE

## 2021-05-03 MED ORDER — IBUPROFEN 600 MG PO TABS
600.0000 mg | ORAL_TABLET | Freq: Four times a day (QID) | ORAL | Status: DC
  Administered 2021-05-03 – 2021-05-05 (×10): 600 mg via ORAL
  Filled 2021-05-03 (×9): qty 1

## 2021-05-03 MED ORDER — BENZOCAINE-MENTHOL 20-0.5 % EX AERO
1.0000 "application " | INHALATION_SPRAY | CUTANEOUS | Status: DC | PRN
  Administered 2021-05-03: 1 via TOPICAL

## 2021-05-03 MED ORDER — OXYTOCIN 10 UNIT/ML IJ SOLN
INTRAMUSCULAR | Status: AC
  Filled 2021-05-03: qty 1

## 2021-05-03 MED ORDER — ACETAMINOPHEN 325 MG PO TABS
ORAL_TABLET | ORAL | Status: AC
  Filled 2021-05-03: qty 2

## 2021-05-03 MED ORDER — DOCUSATE SODIUM 100 MG PO CAPS
100.0000 mg | ORAL_CAPSULE | Freq: Two times a day (BID) | ORAL | Status: DC
  Administered 2021-05-03 – 2021-05-05 (×4): 100 mg via ORAL
  Filled 2021-05-03 (×4): qty 1

## 2021-05-03 MED ORDER — IBUPROFEN 600 MG PO TABS
ORAL_TABLET | ORAL | Status: AC
  Filled 2021-05-03: qty 1

## 2021-05-03 MED ORDER — PRENATAL MULTIVITAMIN CH
1.0000 | ORAL_TABLET | Freq: Every day | ORAL | Status: DC
  Administered 2021-05-03 – 2021-05-04 (×2): 1 via ORAL
  Filled 2021-05-03 (×2): qty 1

## 2021-05-03 MED ORDER — MISOPROSTOL 200 MCG PO TABS
ORAL_TABLET | ORAL | Status: AC
  Filled 2021-05-03: qty 4

## 2021-05-03 MED ORDER — SIMETHICONE 80 MG PO CHEW: 80.0000 mg | CHEWABLE_TABLET | ORAL | Status: DC | PRN

## 2021-05-03 MED ORDER — ZOLPIDEM TARTRATE 5 MG PO TABS: 5.0000 mg | ORAL_TABLET | Freq: Every evening | ORAL | Status: DC | PRN

## 2021-05-03 MED ORDER — LIDOCAINE HCL (PF) 1 % IJ SOLN
INTRAMUSCULAR | Status: AC
  Filled 2021-05-03: qty 30

## 2021-05-03 MED ORDER — BENZOCAINE-MENTHOL 20-0.5 % EX AERO
INHALATION_SPRAY | CUTANEOUS | Status: AC
  Filled 2021-05-03: qty 56

## 2021-05-03 MED ORDER — ACETAMINOPHEN 325 MG PO TABS
650.0000 mg | ORAL_TABLET | ORAL | Status: DC | PRN
  Administered 2021-05-03: 03:00:00 650 mg via ORAL

## 2021-05-03 MED ORDER — OXYTOCIN-SODIUM CHLORIDE 30-0.9 UT/500ML-% IV SOLN
INTRAVENOUS | Status: AC
  Filled 2021-05-03: qty 500

## 2021-05-03 MED ORDER — TETANUS-DIPHTH-ACELL PERTUSSIS 5-2.5-18.5 LF-MCG/0.5 IM SUSY
0.5000 mL | PREFILLED_SYRINGE | INTRAMUSCULAR | Status: DC | PRN
  Filled 2021-05-03: qty 0.5

## 2021-05-03 MED ORDER — OXYTOCIN-SODIUM CHLORIDE 30-0.9 UT/500ML-% IV SOLN: 2.5000 [IU]/h | INTRAVENOUS | Status: DC | PRN

## 2021-05-03 MED ORDER — DIPHENHYDRAMINE HCL 25 MG PO CAPS: 25.0000 mg | ORAL_CAPSULE | Freq: Four times a day (QID) | ORAL | Status: DC | PRN

## 2021-05-03 MED ORDER — OXYCODONE-ACETAMINOPHEN 5-325 MG PO TABS: 1.0000 | ORAL_TABLET | ORAL | Status: DC | PRN

## 2021-05-03 NOTE — H&P (Addendum)
History and Physical   HPI  Alice Reyes is a 35 y.o. [id number] at [redacted]w[redacted]d Estimated Date of Delivery: 05/29/21 who is being admitted for labor management. Pt says contractions started @ 1 hour before admission.   OB History  OB History  Gravida Para Term Preterm AB Living  6 2 2  0 3 2  SAB IAB Ectopic Multiple Live Births  0 0 0 0 2    # Outcome Date GA Lbr Len/2nd Weight Sex Delivery Anes PTL Lv  6 Current           5 Term 01/08/18   2637 g M Vag-Spont   LIV  4 Term 10/17/07   2637 g F Vag-Spont   LIV  3 AB      TAB     2 AB      TAB     1 AB      TAB       PROBLEM LIST  Pregnancy complications or risks: There are no problems to display for this patient.   Prenatal labs and studies: ABO, Rh: A/Positive/-- (06/13 1126) Antibody: Negative (06/13 1126) Rubella: 5.10 (06/13 1126) RPR: Non Reactive (06/13 1126)  HBsAg: Negative (06/13 1126)  HIV: Non Reactive (06/13 1126)  GBS:    Past Medical History:  Diagnosis Date   Anemia    Asthma      No past surgical history on file.   Medications    Current Discharge Medication List     CONTINUE these medications which have NOT CHANGED   Details  Ferrous Sulfate (IRON) 325 (65 Fe) MG TABS Take by mouth. Twice a day   Associated Diagnoses: Encounter for supervision of other normal pregnancy in first trimester    Prenatal Vit-Fe Fumarate-FA (PRENATAL MULTIVITAMIN) TABS tablet Take 1 tablet by mouth daily at 12 noon.   Associated Diagnoses: Encounter for supervision of other normal pregnancy in first trimester         Allergies  Patient has no known allergies.  Review of Systems  Pertinent items noted in HPI and remainder of comprehensive ROS otherwise negative.  Physical Exam  LMP 08/22/2020   Lungs:  CTA B Cardio: RRR without M/R/G Abd: Soft, gravid, NT Presentation: cephalic EXT: No C/C/ 1+ Edema DTRs: 2+ B CERVIX: Dilation: 10 Dilation Complete Date: 05/03/21 Dilation Complete Time:  0205 Effacement (%): 100 Cervical Position: Middle Station: Crowning Presentation: Vertex Exam by:: [email or web address] MD    FHR:  Variability: Good {> 6 bpm)  Toco: Uterine Contractions: Regular  Test Results  Results for orders placed or performed during the hospital encounter of 05/03/21 (from the past 24 hour(s))  CBC     Status: Abnormal   Collection Time: 05/03/21  1:59 AM  Result Value Ref Range   WBC 8.4 4.0 - 10.5 K/uL   RBC 3.94 3.87 - 5.11 MIL/uL   Hemoglobin 8.5 (L) 12.0 - 15.0 g/dL   HCT 05/05/21 (L) 89.2 - 11.9 %   MCV 74.1 (L) 80.0 - 100.0 fL   MCH 21.6 (L) 26.0 - 34.0 pg   MCHC 29.1 (L) 30.0 - 36.0 g/dL   RDW 41.7 (H) 40.8 - 14.4 %   Platelets 247 150 - 400 K/uL   nRBC 0.0 0.0 - 0.2 %    Assessment   81.8 at [redacted]w[redacted]d Estimated Date of Delivery: 05/29/21  The fetus is reassuring.  Pre-term Labor  There are no problems to display for this patient.   Plan  1.  Admit to L&D :   2. EFM: -- Category 1 3. Stadol or Epidural if desired.   4. Admission labs  5. Expect SVD  Elonda Husky, M.D. 05/03/2021 2:35 AM

## 2021-05-03 NOTE — Lactation Note (Signed)
This note was copied from a baby's chart. Lactation Consultation Note  Patient Name: Alice Reyes Alice Reyes Date: 05/03/2021 Reason for consult: Initial assessment;Late-preterm 34-36.6wks;Infant < 6lbs Age:33 hours  Initial lactation visit. Mom is P3, SVD 7hours ago. Mom breastfed her last child, now 70yrs old for 3 years- just recently discontinuing. There is feedings and output documented since delivery. Mom feels baby does better on her L breast than her R, and remembers her other child did the same- she pumped the R side for stimulation and relief. We discussed the late-preterm feeding protocol which encourages some supplementation of EBM, DBM, or formula post feedings- We discussed feeding patterns and behaviors of late preterm babies and their effectiveness at the breast. Mom reports she supplemented early on with her last child as well and is open to supplementation and also pumping if needed. She desires to take at nap at this time; RN to follow-up post next feeding.  Maternal Data Has patient been taught Hand Expression?: Yes Does the patient have breastfeeding experience prior to this delivery?: Yes How long did the patient breastfeed?: 85yrs (with last baby; now 69yrs old)  Feeding Mother's Current Feeding Choice: Breast Milk and Formula  LATCH Score Latch: Repeated attempts needed to sustain latch, nipple held in mouth throughout feeding, stimulation needed to elicit sucking reflex.  Audible Swallowing: A few with stimulation  Type of Nipple: Everted at rest and after stimulation  Comfort (Breast/Nipple): Soft / non-tender  Hold (Positioning): No assistance needed to correctly position infant at breast.  LATCH Score: 8   Lactation Tools Discussed/Used    Interventions Interventions: Breast feeding basics reviewed;Hand express;Education (preterm feeding protocol)  Discharge    Consult Status Consult Status: Follow-up Date: 05/03/21 Follow-up type:  In-patient    Danford Bad 05/03/2021, 9:53 AM

## 2021-05-03 NOTE — Plan of Care (Signed)
Transferred to Room 343. Alert and oriented with aprop. Affect. Color good, skin w&d. BBS clear. Denies c/o. Oriented to Room, Safety and Security, Fall Prevention and POC. Pt. V/O. Teaching initiated.

## 2021-05-04 NOTE — Progress Notes (Signed)
Progress Note - Vaginal Delivery  Alice Reyes is a 35 y.o. [phone] now PP day 1 s/p Vaginal, Spontaneous .   Subjective:  The patient reports no complaints, up ad lib, voiding, and tolerating PO   Objective:  Vital signs in last 24 hours: Temp:  [98.1 F (36.7 C)-98.9 F (37.2 C)] 98.5 F (36.9 C) (11/29 0739) Pulse Rate:  [64-77] 74 (11/29 0739) Resp:  [17-20] 20 (11/29 0739) BP: (105-111)/(70-79) 105/74 (11/29 0739) SpO2:  [98 %-100 %] 100 % (11/29 0739)  Physical Exam:  General: alert, cooperative, and no distress Lochia: appropriate Uterine Fundus: firm    Data Review Recent Labs    05/03/21 0159  HGB 8.5*  HCT 29.2*    Assessment/Plan: Principal Problem:   Preterm labor   Plan for discharge tomorrow Baby has to stay for inadequately treated GBS.  -- Continue routine PP care.     Elonda Husky, M.D. 05/04/2021 9:57 AM

## 2021-05-04 NOTE — Lactation Note (Signed)
This note was copied from a baby's chart. Lactation Consultation Note  Patient Name: Alice Reyes [id number] Date: 05/04/2021   Age:33 hours  Lactation follow-up. Mom continues to BF without concerns. Mom will continue to supplement based on pre-term feeding protocol.   Will reach out for support if needed.   Maternal Data    Feeding    LATCH Score                    Lactation Tools Discussed/Used    Interventions    Discharge    Consult Status      Alice Reyes 05/04/2021, 10:04 AM

## 2021-05-05 NOTE — Discharge Summary (Signed)
Patient Name: Alice Reyes DOB: 04/28/1986 MRN: [id number]                            Discharge Summary  Date of Admission: 05/03/2021 Date of Discharge: 05/05/2021 Delivering Provider: Harlin Heys   Admitting Diagnosis: Preterm labor [O60.00] at [redacted]w[redacted]d Secondary diagnosis:  Principal Problem:   Preterm labor   Viable infant Mode of Delivery: normal spontaneous vaginal delivery              Discharge diagnosis: Preterm Pregnancy Delivered      Intrapartum Procedures:    Post partum procedures:   Complications: none                     Discharge Day SOAP Note:  Progress Note - Vaginal Delivery  Nino Kirschner is a 35 y.o. [id number] now PP day 2 s/p Vaginal, Spontaneous . Delivery was uncomplicated  Subjective  The patient has the following complaints: has no unusual complaints  Pain is controlled with current medications.   Patient is urinating without difficulty.  She is ambulating well.     Objective  Vital signs: BP 118/80 (BP Location: Right Arm)   Pulse 64   Temp 98.4 F (36.9 C) (Oral)   Resp 18   Ht 5\' 7"  (1.702 m)   Wt 72.6 kg   LMP 08/22/2020   SpO2 100%   Breastfeeding Unknown   BMI 25.06 kg/m   Physical Exam: Gen: NAD Fundus Fundal Tone: Firm  Lochia Amount: Scant        Data Review Labs: Lab Results  Component Value Date   WBC 8.4 05/03/2021   HGB 8.5 (L) 05/03/2021   HCT 29.2 (L) 05/03/2021   MCV 74.1 (L) 05/03/2021   PLT 247 05/03/2021   CBC Latest Ref Rng & Units 05/03/2021  WBC 4.0 - 10.5 K/uL 8.4  Hemoglobin 12.0 - 15.0 g/dL 8.5(L)  Hematocrit 36.0 - 46.0 % 29.2(L)  Platelets 150 - 400 K/uL 247   A POS Performed at [facility], [address] [place], [place] [address]   Flavia Shipper Score: Flavia Shipper Postnatal Depression Scale Screening Tool 05/03/2021  I have been able to laugh and see the funny side of things. 0  I have looked forward with enjoyment to things. 0  I have blamed myself unnecessarily  when things went wrong. 0  I have been anxious or worried for no good reason. 0  I have felt scared or panicky for no good reason. 0  Things have been getting on top of me. 0  I have been so unhappy that I have had difficulty sleeping. 0  I have felt sad or miserable. 0  I have been so unhappy that I have been crying. 0  The thought of harming myself has occurred to me. 0  Edinburgh Postnatal Depression Scale Total 0    Assessment/Plan  Principal Problem:   Preterm labor    Plan for discharge today.  Discharge Instructions: Per After Visit Summary. Activity: Advance as tolerated. Pelvic rest for 6 weeks.  Also refer to After Visit Summary Diet: Regular Medications: Allergies as of 05/05/2021   No Known Allergies      Medication List     TAKE these medications    albuterol 0.63 MG/3ML nebulizer solution Commonly known as: ACCUNEB Take 1 ampule by nebulization every 6 (six) hours as needed for wheezing.   Iron 325 (65  Fe) MG Tabs Take by mouth. Twice a day   prenatal multivitamin Tabs tablet Take 1 tablet by mouth daily at 12 noon.       Outpatient follow up:   Follow-up Information     [facility] Dept Personal Follow up in 2 week(s).   Why: Pt has already contacted them Contact information: [address] [place] [address] [phone]                Postpartum contraception: Will discuss at first office visit post-partum  Discharged Condition: good  Discharged to: home  Newborn Data: Disposition:home with mother  Apgars: APGAR (1 MIN): 8   APGAR (5 MINS): 9   APGAR (10 MINS):    Baby Feeding: Breast    Elonda Husky, M.D. 05/05/2021 7:47 AM

## 2021-05-05 NOTE — Progress Notes (Signed)
Patient discharged home with family.  Discharge instructions, when to follow up, and medications reviewed with patient.  Patient verbalized understanding. Patient will be escorted out by auxiliary.   

## 2021-05-05 NOTE — Lactation Note (Signed)
This note was copied from a baby's chart. Lactation Consultation Note  Patient Name: Boy Jezreel Sisk [id number] Date: 05/05/2021 Reason for consult: Follow-up assessment;Late-preterm 34-36.6wks;Infant < 6lbs Age:33 hours  Lactation follow-up prior to anticipated discharge. Mom has been overall independent in her feedings of baby Wilber Oliphant, she is breastfeeding on demand and providing formula supplement due to infants age. Mom says she did the same with her last baby for the first 1-2 weeks and then fully BF. We would anticipate the same process for [facility]. LC reviewed anticipatory guidance for breastfeeding in the days/weeks to come, encouraged to continue to track output, management of breast fullness/softening of tissue prior to feeding, option of expressing milk post BF if needed- this can be supplement as well. Information for outpatient lactation services provided and community support.  Maternal Data Has patient been taught Hand Expression?: Yes Does the patient have breastfeeding experience prior to this delivery?: Yes How long did the patient breastfeed?: 68yrs  Feeding Mother's Current Feeding Choice: Breast Milk and Formula  LATCH Score                    Lactation Tools Discussed/Used    Interventions Interventions: Pace feeding;Education  Discharge Discharge Education: Engorgement and breast care;Warning signs for feeding baby;Outpatient recommendation  Consult Status Consult Status: Complete    Danford Bad 05/05/2021, 11:00 AM

## 2021-05-05 NOTE — Discharge Instructions (Signed)

## 2021-05-13 ENCOUNTER — Ambulatory Visit: Payer: Self-pay

## 2022-05-24 IMAGING — US US OB COMP LESS 14 WK
1 series · 14 of 28 positions shown · non-contrast
Comparison: None.

CLINICAL DATA: Dating

EXAM:
OBSTETRIC <14 WK ULTRASOUND
TECHNIQUE: Transabdominal ultrasound was performed for evaluation of the
gestation as well as the maternal uterus and adnexal regions.

[Series 1: us ob comp less 14 wk · 0.22mm/px · 53 acquisitions, 14 frames shown]
[im 2/53]
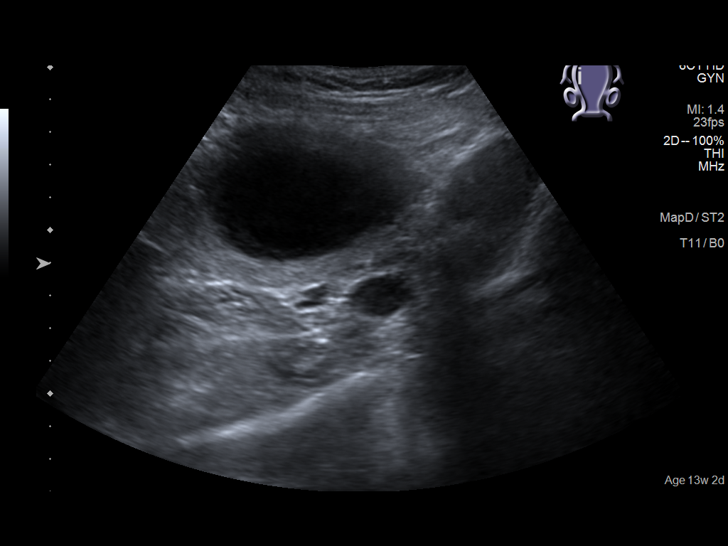
[im 6/53]
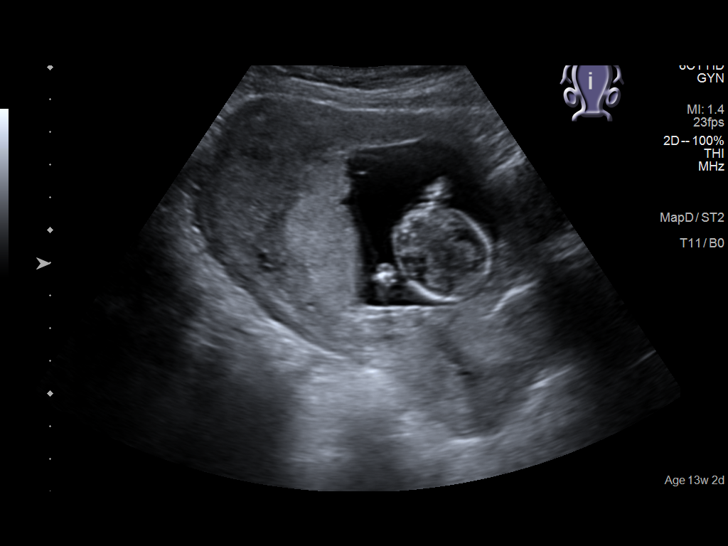
[im 10/53]
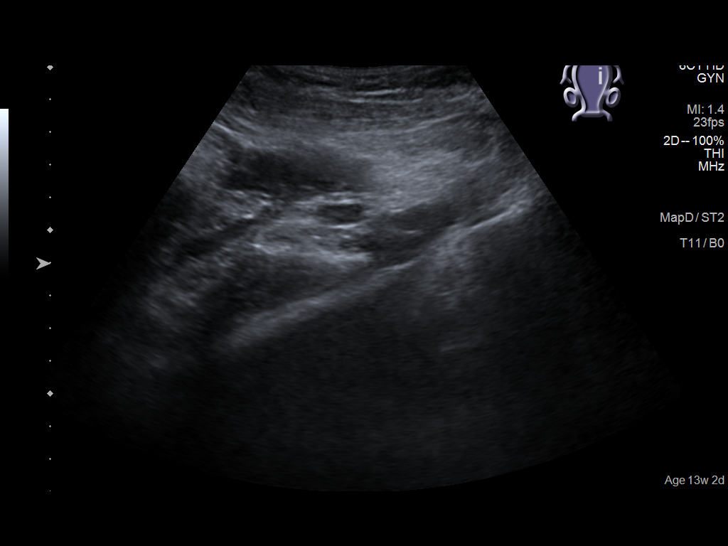
[im 14/53]
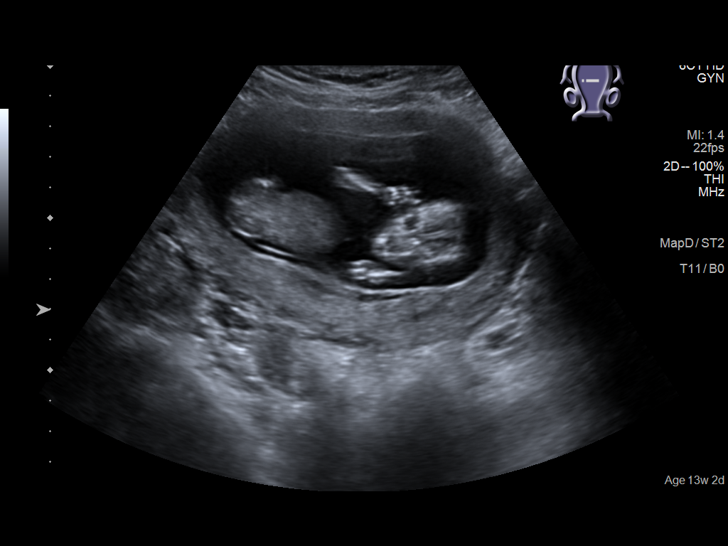
[im 18/53]
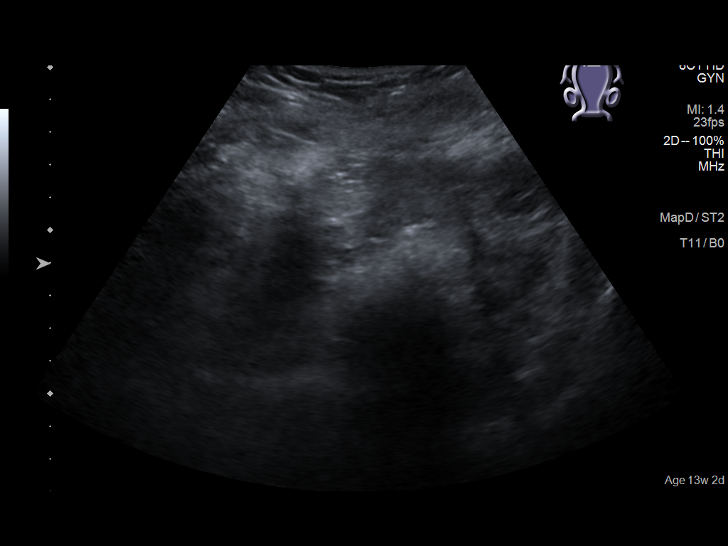
[im 22/53]
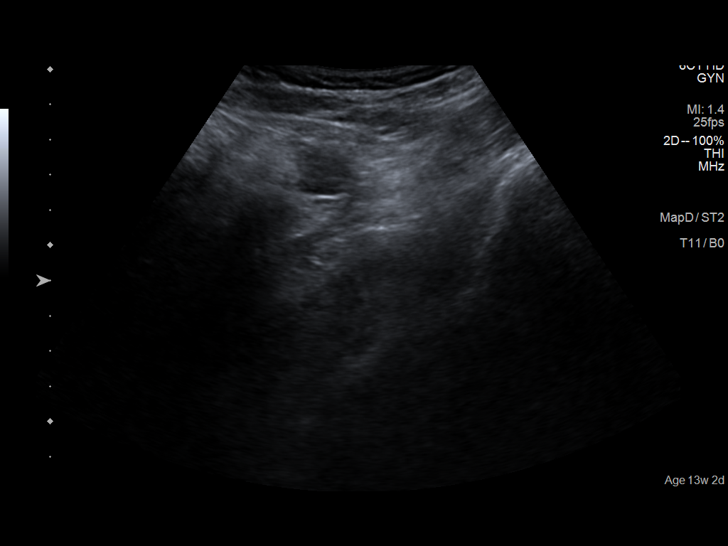
[im 26/53]
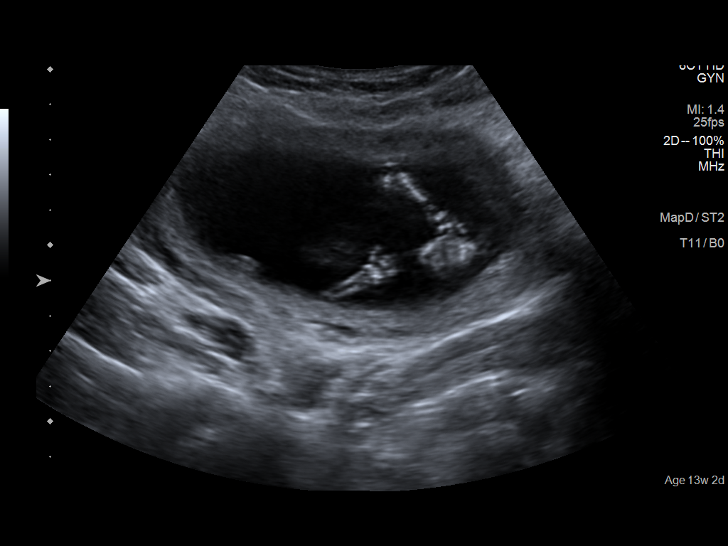
[im 29/53]
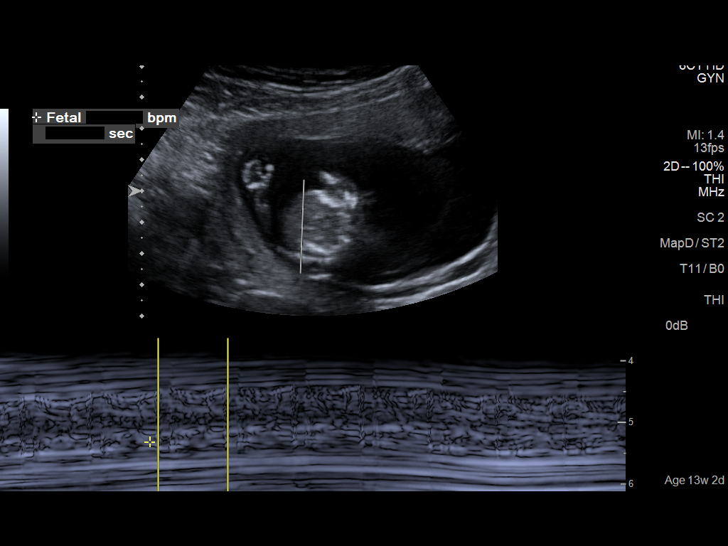
[im 33/53]
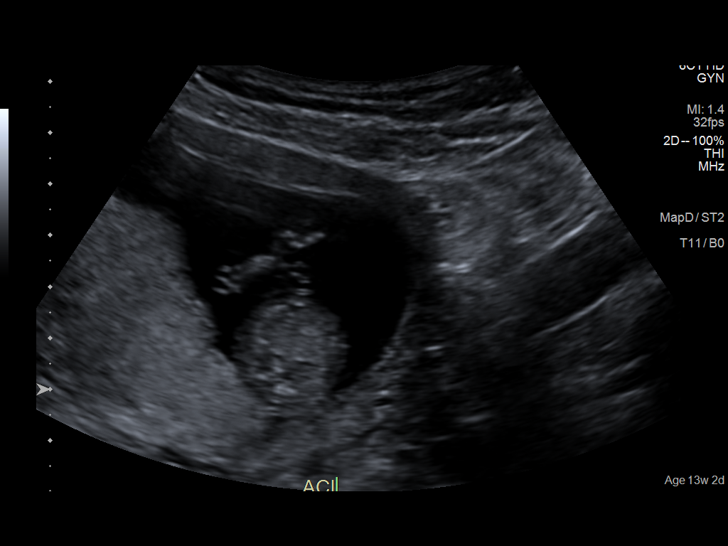
[im 37/53]
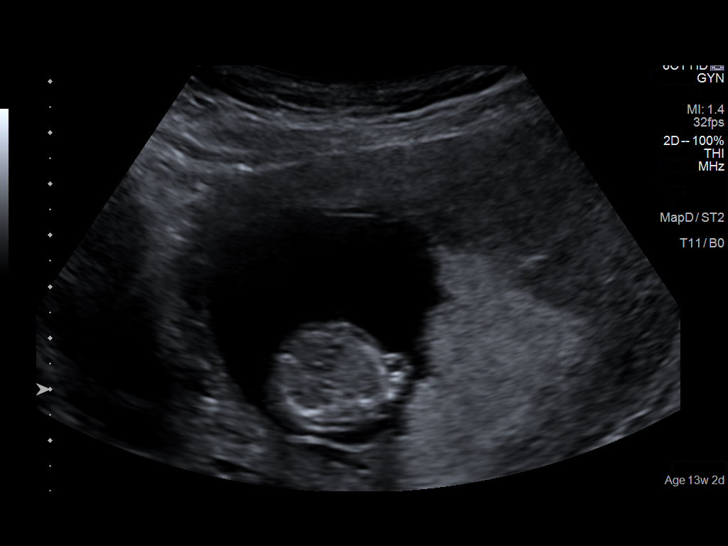
[im 41/53]
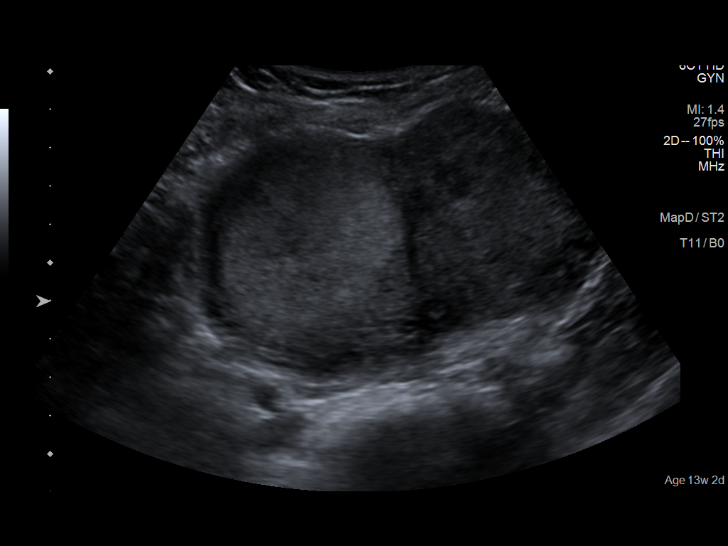
[im 45/53]
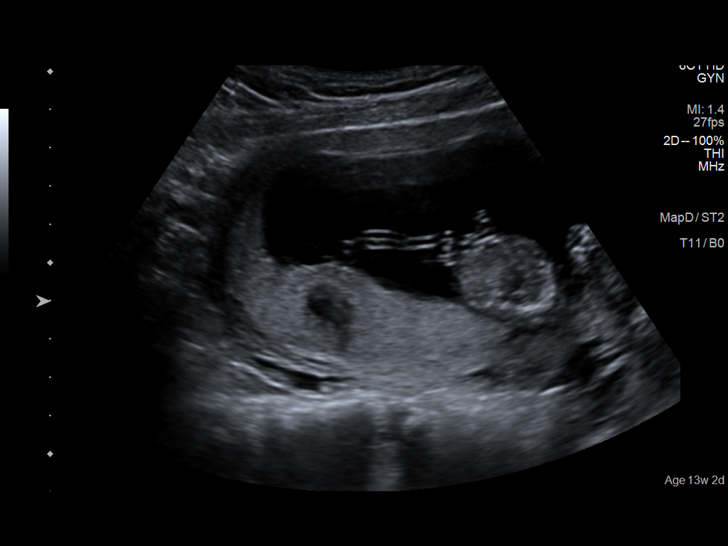
[im 49/53]
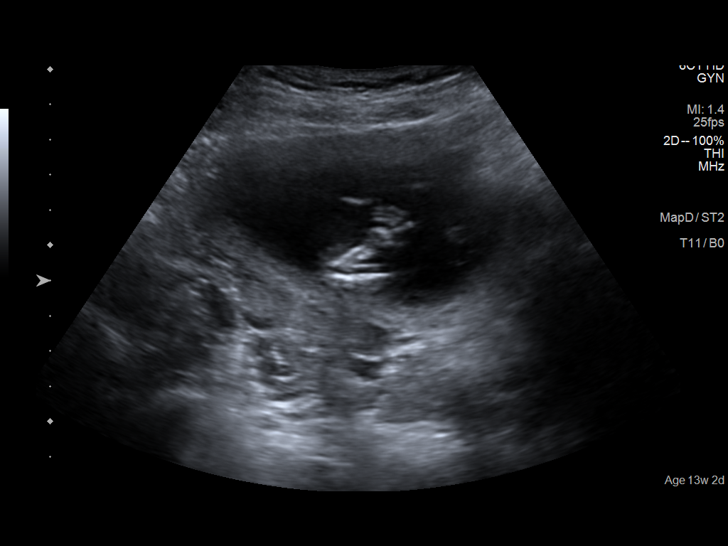
[im 53/53]
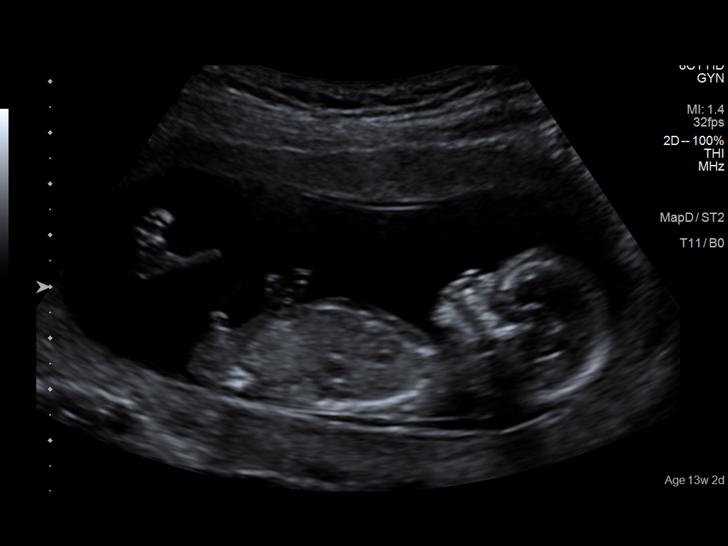

[14 of 28 positions shown; findings below may reference images not displayed]

FINDINGS: Intrauterine gestational sac: Single

Yolk sac:  Not Visualized.

Embryo:  Visualized.

Cardiac Activity: Visualized.

Heart Rate: 147 bpm

CRL:   78.9 mm   13 w 6 d                  US EDC: 05/25/2021

Subchorionic hemorrhage:  None visualized.

Maternal uterus/adnexae: Bilateral ovaries could not be visualized.
No free fluid in the pelvis.
IMPRESSION: Single viable intrauterine pregnancy with estimated gestational age
of 13 weeks 6 days.

## 2022-09-07 IMAGING — US US MFM OB FOLLOW-UP
1 series · 14 of 28 positions shown · non-contrast
Comparison: none

[Series 1: us mfm ob follow-up · 14 of 42 slices shown]
[im 2/42]
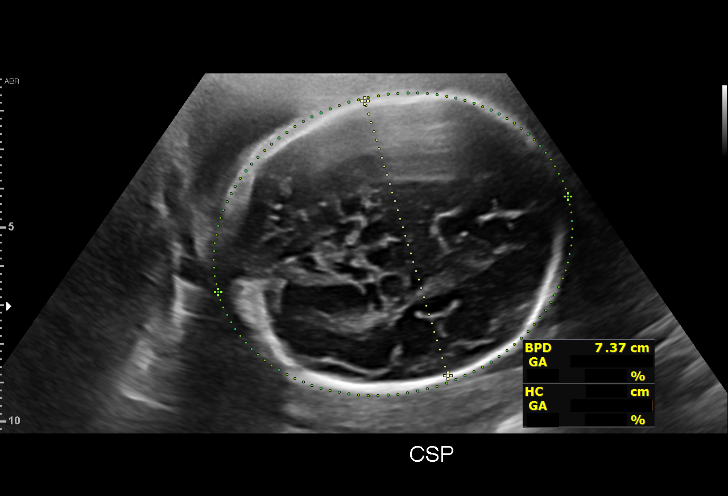
[im 5/42]
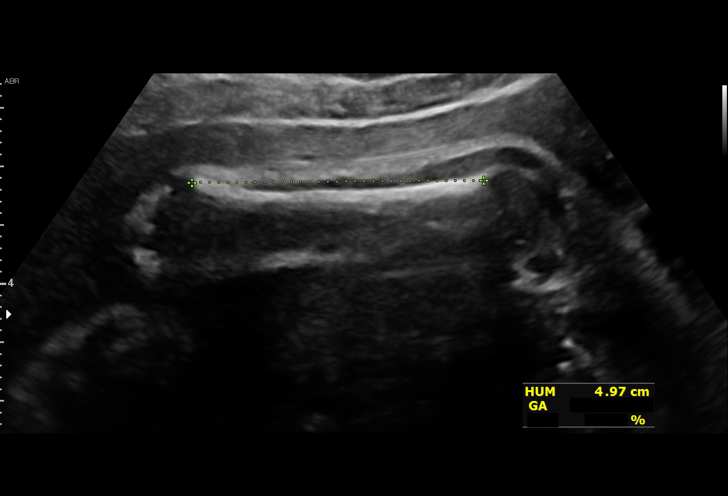
[im 8/42]
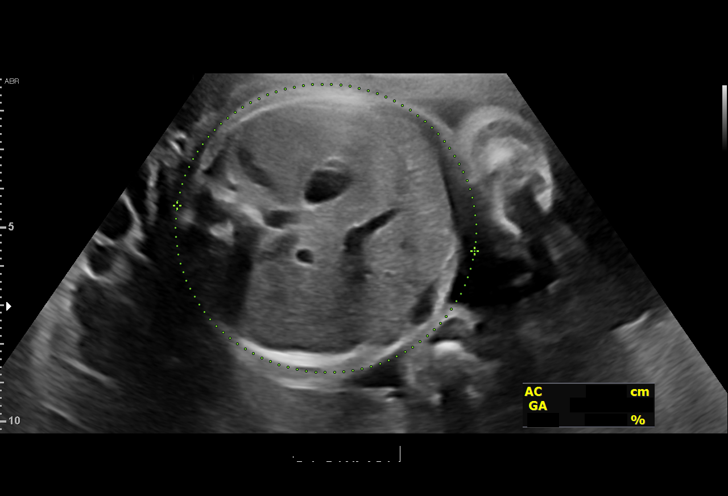
[im 11/42]
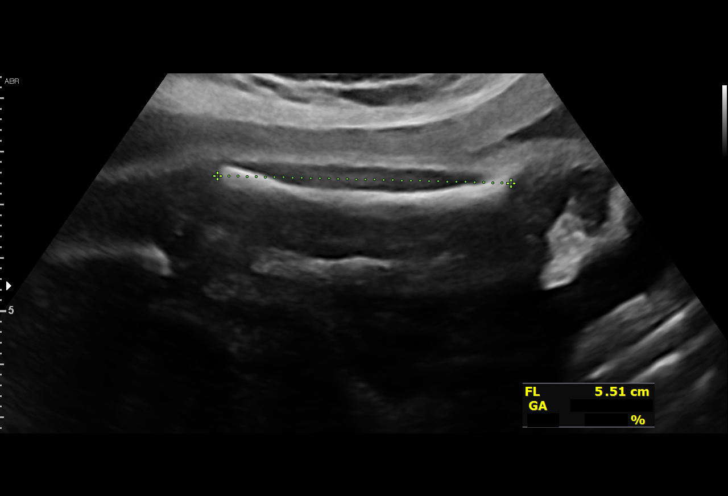
[im 14/42]
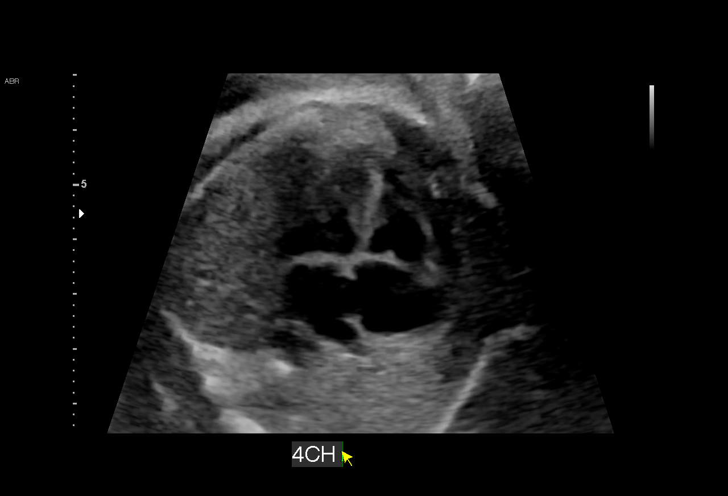
[im 17/42]
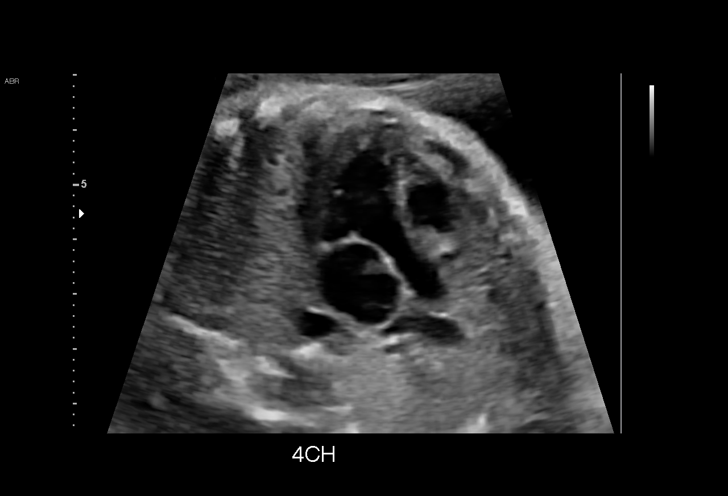
[im 20/42]
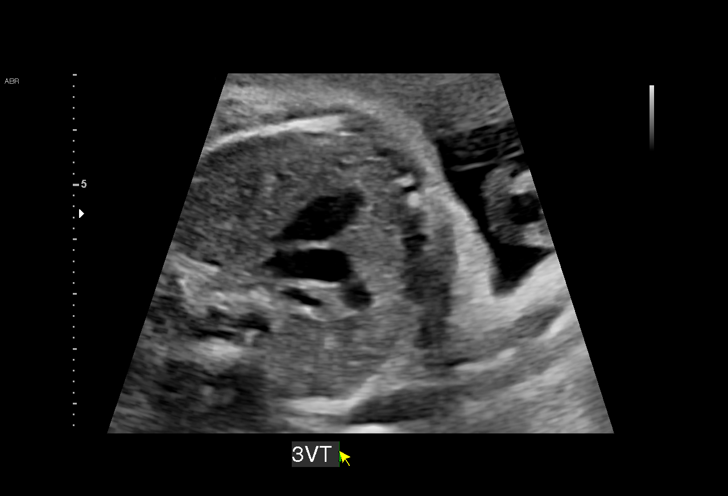
[im 23/42]
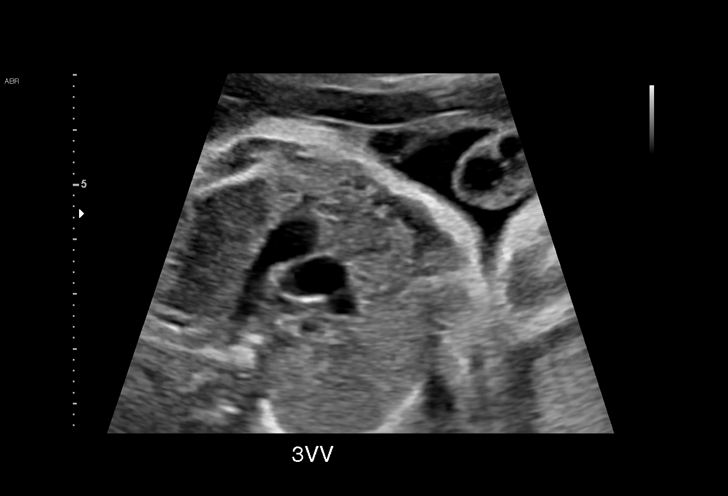
[im 26/42]
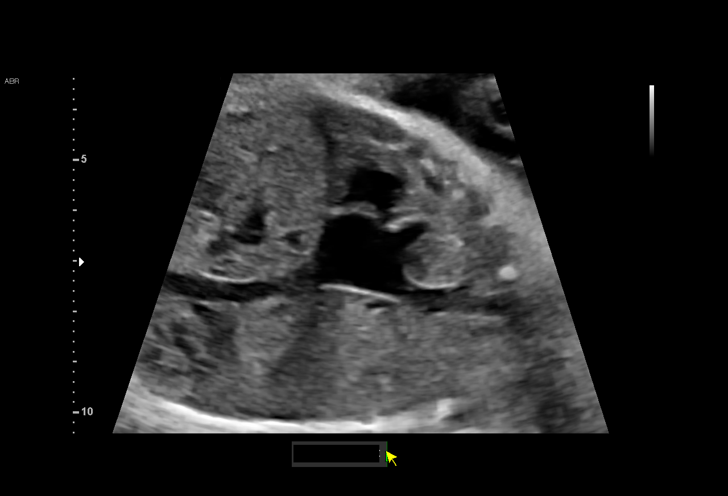
[im 29/42]
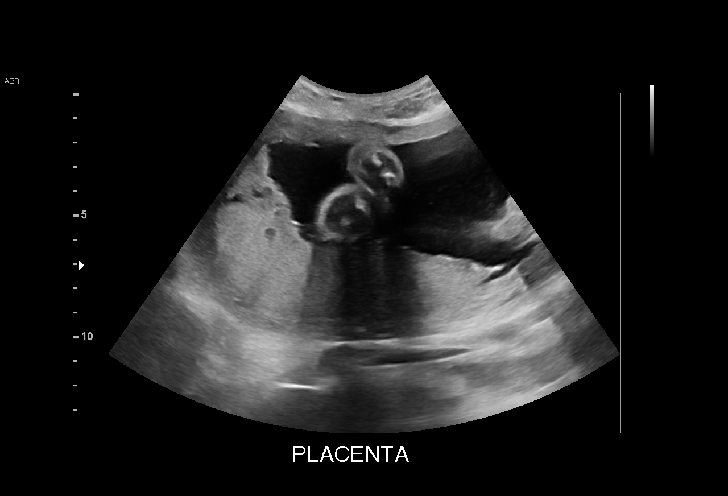
[im 32/42]
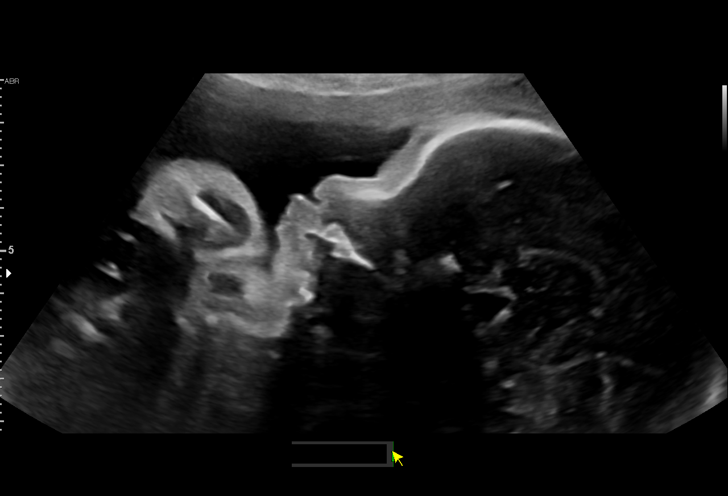
[im 35/42]
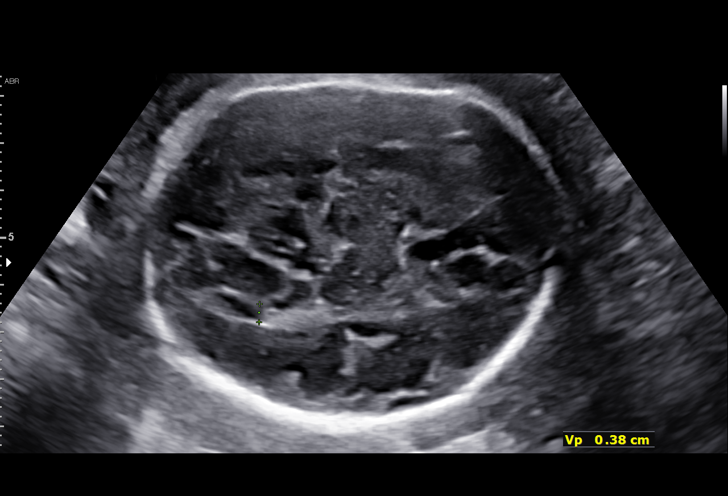
[im 38/42]
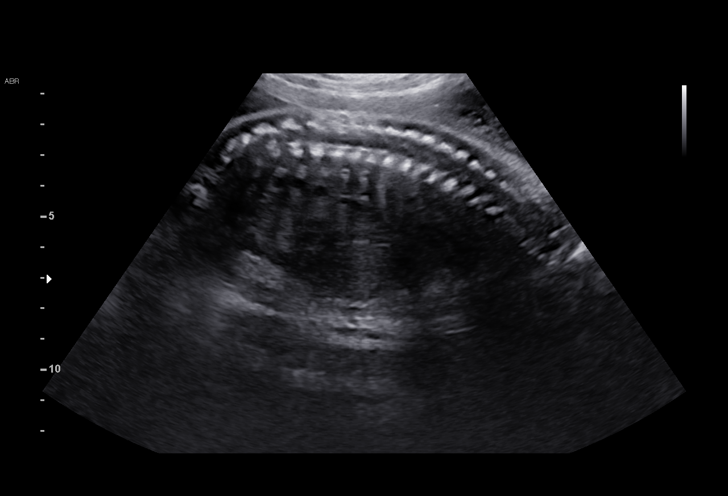
[im 42/42]
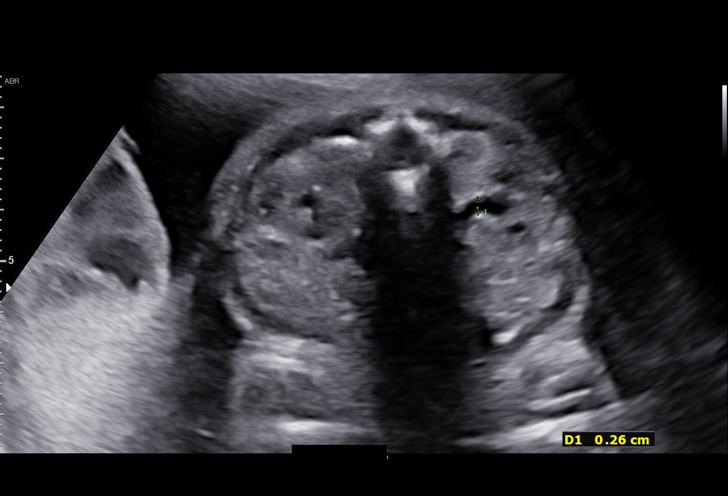

[14 of 28 positions shown; findings below may reference images not displayed]

[facility]

Indications

 28 weeks gestation of pregnancy
 2 vessel umbilical cord
 Encounter for antenatal screening for
 malformations
Fetal Evaluation

 Num Of Fetuses:         1
 Fetal Heart Rate(bpm):  153
 Cardiac Activity:       Observed
 Presentation:           Cephalic
 Placenta:               Posterior
 P. Cord Insertion:      Visualized, central

 Amniotic Fluid
 AFI FV:      Within normal limits

 AFI Sum(cm)     %Tile       Largest Pocket(cm)
 13.5            41

 RUQ(cm)       RLQ(cm)       LUQ(cm)        LLQ(cm)
 2.9           3
Biometry

 BPD:      72.9  mm     G. Age:  29w 2d         65  %    CI:         74.2   %    70 - 86
                                                         FL/HC:      20.5   %    18.8 -
 HC:      268.7  mm     G. Age:  29w 2d         44  %    HC/AC:      1.13        1.05 -
 AC:      237.7  mm     G. Age:  28w 1d         32  %    FL/BPD:     75.6   %    71 - 87
 FL:       55.1  mm     G. Age:  29w 0d         54  %    FL/AC:      23.2   %    20 - 24
 HUM:      49.7  mm     G. Age:  29w 1d         59  %
 CER:      36.1  mm     G. Age:  30w 0d         89  %
 LV:        3.8  mm
 CM:        4.7  mm
 Est. FW:    2176  gm    2 lb 12 oz      43  %
OB History

 Gravidity:    6         Term:   2         SAB:   3
 Living:       2
Gestational Age

 LMP:           28w 3d        Date:  08/22/20                 EDD:   05/29/21
 U/S Today:     29w 0d                                        EDD:   05/25/21
 Best:          28w 3d     Det. By:  LMP  (08/22/20)          EDD:   05/29/21
Anatomy

 Cranium:               Appears normal         LVOT:                   Appears normal
 Cavum:                 Appears normal         Aortic Arch:            Previously seen
 Ventricles:            Appears normal         Ductal Arch:            Previously seen
 Choroid Plexus:        Appears normal         Diaphragm:              Appears normal
 Cerebellum:            Appears normal         Stomach:                Appears normal, left
                                                                       sided
 Posterior Fossa:       Appears normal         Abdomen:                Appears normal
 Nuchal Fold:           Not applicable (>20    Abdominal Wall:         Previously seen
                        wks GA)
 Face:                  Appears normal         Cord Vessels:           2 vessel cord,
                        (orbits and profile)
                                                                       absent right [place] Tchagnao
 Lips:                  Appears normal         Kidneys:                Appear normal
 Palate:                Previously seen        Bladder:                Appears normal
 Thoracic:              Appears normal         Spine:                  Appears normal
 Heart:                 Appears normal         Upper Extremities:      Previously seen
                        (4CH, axis, and
                        situs)
 RVOT:                  Appears normal         Lower Extremities:      Previously seen
Impression

 Single umbilical artery.  Patient returned for fetal growth
 assessment.

 Amniotic fluid is normal and good fetal activity is seen .Fetal
 growth is appropriate for gestational age .

 We reassured the patient of the findings
Recommendations

 -An appointment was made for her to return in 5 weeks for
 fetal growth assessment.
                 Pal, Blanqui
